# Patient Record
Sex: Female | Born: 1995 | Race: White | Hispanic: No | Smoking: Former smoker
Health system: Southern US, Community
[De-identification: ages and names within clinical notes are randomized; demographics above are authoritative.]

## PROBLEM LIST (undated history)

## (undated) ENCOUNTER — Inpatient Hospital Stay (HOSPITAL_COMMUNITY): Payer: Self-pay

## (undated) DIAGNOSIS — B999 Unspecified infectious disease: Secondary | ICD-10-CM

## (undated) DIAGNOSIS — A4902 Methicillin resistant Staphylococcus aureus infection, unspecified site: Secondary | ICD-10-CM

## (undated) DIAGNOSIS — D649 Anemia, unspecified: Secondary | ICD-10-CM

## (undated) HISTORY — PX: NO PAST SURGERIES: SHX2092

## (undated) HISTORY — PX: THERAPEUTIC ABORTION: SHX798

## (undated) HISTORY — DX: Methicillin resistant Staphylococcus aureus infection, unspecified site: A49.02

---
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
## 2016-03-25 ENCOUNTER — Encounter: Payer: Self-pay | Admitting: *Deleted

## 2016-04-01 ENCOUNTER — Encounter: Payer: Self-pay | Admitting: Obstetrics & Gynecology

## 2018-09-19 NOTE — L&D Delivery Note (Addendum)
Patient: Shannon Lewis MRN: [id number]  GBS status: negative, IAP given None  Patient is a 23 y.o. now G2P1011 s/p NSVD at [redacted]w[redacted]d, who was admitted for SROM. SROM 24h 86m prior to delivery with clear fluid.   Delivery Note At 12:43 AM a viable and healthy female was delivered via Vaginal, Spontaneous (Presentation: LOA; Vertex).  APGAR: pending; weight  pending.   Placenta status: spontaneous, intact.  3 vessel cord. With no complications.   Anesthesia:  Epidural Episiotomy: None Lacerations: Superficial bilateral labial lacerations Suture Repair: N/A Est. Blood Loss (mL): 300  Head delivered LOA. One loose nuchal cord present. Shoulder and body delivered in usual fashion. Infant with spontaneous cry, placed on mother's abdomen, dried and bulb suctioned. Cord clamped x 2 after 1-minute delay, and cut by family member. Cord blood drawn. Placenta delivered spontaneously with gentle cord traction. Fundus firm with massage and Pitocin. Perineum inspected and found to have small bilateral superficial labial lacerations, which was found to be hemostatic.  Mom to postpartum.  Baby to Couplet care / Skin to Skin.  [place] 06/23/2019, 12:59 AM

## 2018-11-05 ENCOUNTER — Encounter: Payer: Self-pay | Admitting: Obstetrics

## 2018-11-05 ENCOUNTER — Ambulatory Visit (INDEPENDENT_AMBULATORY_CARE_PROVIDER_SITE_OTHER): Payer: Self-pay

## 2018-11-05 DIAGNOSIS — Z3201 Encounter for pregnancy test, result positive: Secondary | ICD-10-CM

## 2018-11-05 DIAGNOSIS — Z3200 Encounter for pregnancy test, result unknown: Secondary | ICD-10-CM

## 2018-11-05 LAB — POCT URINE PREGNANCY: Preg Test, Ur: POSITIVE — AB

## 2018-11-05 NOTE — Progress Notes (Signed)
Pt is here for confirmation of pregnancy, UPT is positive. Pt given safe medication list and states she is currently taking an OTC prenatal vitamin. Pt instructed to make Initial OB visit the 1st or 2nd week of march based on LMP of 09/11/18. Pt verbalized understanding.

## 2018-12-11 ENCOUNTER — Other Ambulatory Visit (HOSPITAL_COMMUNITY)
Admission: RE | Admit: 2018-12-11 | Discharge: 2018-12-11 | Disposition: A | Discharge status: Home / Self Care | Payer: Medicaid Other | Source: Ambulatory Visit | Attending: Obstetrics | Admitting: Obstetrics

## 2018-12-11 ENCOUNTER — Encounter: Payer: Self-pay | Admitting: Obstetrics

## 2018-12-11 ENCOUNTER — Ambulatory Visit (INDEPENDENT_AMBULATORY_CARE_PROVIDER_SITE_OTHER): Payer: Medicaid Other | Admitting: Obstetrics

## 2018-12-11 ENCOUNTER — Other Ambulatory Visit: Payer: Self-pay

## 2018-12-11 VITALS — BP 104/64 | HR 108 | Ht 63.0 in | Wt 122.7 lb

## 2018-12-11 DIAGNOSIS — Z3481 Encounter for supervision of other normal pregnancy, first trimester: Secondary | ICD-10-CM

## 2018-12-11 DIAGNOSIS — Z3A13 13 weeks gestation of pregnancy: Secondary | ICD-10-CM

## 2018-12-11 DIAGNOSIS — Z3480 Encounter for supervision of other normal pregnancy, unspecified trimester: Secondary | ICD-10-CM | POA: Diagnosis present

## 2018-12-11 DIAGNOSIS — N3001 Acute cystitis with hematuria: Secondary | ICD-10-CM

## 2018-12-11 DIAGNOSIS — O2311 Infections of bladder in pregnancy, first trimester: Secondary | ICD-10-CM

## 2018-12-11 LAB — POCT URINALYSIS DIPSTICK
Bilirubin, UA: NEGATIVE
Glucose, UA: NEGATIVE
Ketones, UA: NEGATIVE
LEUKOCYTES UA: NEGATIVE
Nitrite, UA: POSITIVE
Protein, UA: NEGATIVE
Spec Grav, UA: 1.015 (ref 1.010–1.025)
Urobilinogen, UA: 0.2 E.U./dL
pH, UA: 6.5 (ref 5.0–8.0)

## 2018-12-11 MED ORDER — NITROFURANTOIN MONOHYD MACRO 100 MG PO CAPS: 100.0000 mg | ORAL_CAPSULE | Freq: Two times a day (BID) | ORAL | 0 refills | Status: DC

## 2018-12-11 MED ORDER — PRENATE MINI 29-0.6-0.4-350 MG PO CAPS: 1.0000 | ORAL_CAPSULE | Freq: Every day | ORAL | 4 refills | Status: DC

## 2018-12-11 NOTE — Progress Notes (Signed)
Pt is here for initial prenatal visit. LMP 09/11/18. EDD 06/18/19.

## 2018-12-11 NOTE — Progress Notes (Signed)
Subjective:    Shannon Lewis is being seen today for her first obstetrical visit.  This is not a planned pregnancy. She is at [redacted]w[redacted]d gestation. Her obstetrical history is significant for NONE. Relationship with FOB: significant other, not living together. Patient does intend to breast feed. Pregnancy history fully reviewed.  The information documented in the HPI was reviewed and verified.  Menstrual History: OB History    Gravida  2   Para      Term      Preterm      AB  1   Living        SAB      TAB  1   Ectopic      Multiple      Live Births               Patient's last menstrual period was 09/11/2018 (exact date).    Past Medical History:  Diagnosis Date  . MRSA (methicillin resistant Staphylococcus aureus) infection 2015    Past Surgical History:  Procedure Laterality Date  . NO PAST SURGERIES      (Not in a hospital admission)  Not on File  Social History   Tobacco Use  . Smoking status: Never Smoker  . Smokeless tobacco: Never Used  Substance Use Topics  . Alcohol use: Not Currently    Family History  Problem Relation Age of Onset  . COPD Mother   . Asthma Mother      Review of Systems Constitutional: negative for weight loss Gastrointestinal: negative for vomiting Genitourinary:negative for genital lesions and vaginal discharge and dysuria Musculoskeletal:negative for back pain Behavioral/Psych: negative for abusive relationship, depression, illegal drug usage and tobacco use    Objective:    BP 104/64   Pulse (!) 108   Ht 5\' 3"  (1.6 m)   Wt 122 lb 11.2 oz (55.7 kg)   LMP 09/11/2018 (Exact Date)   BMI 21.74 kg/m  General Appearance:    Alert, cooperative, no distress, appears stated age  Head:    Normocephalic, without obvious abnormality, atraumatic  Eyes:    PERRL, conjunctiva/corneas clear, EOM's intact, fundi    benign, both eyes  Ears:    Normal TM's and external ear canals, both ears  Nose:   Nares normal, septum  midline, mucosa normal, no drainage    or sinus tenderness  Throat:   Lips, mucosa, and tongue normal; teeth and gums normal  Neck:   Supple, symmetrical, trachea midline, no adenopathy;    thyroid:  no enlargement/tenderness/nodules; no carotid   bruit or JVD  Back:     Symmetric, no curvature, ROM normal, no CVA tenderness  Lungs:     Clear to auscultation bilaterally, respirations unlabored  Chest Wall:    No tenderness or deformity   Heart:    Regular rate and rhythm, S1 and S2 normal, no murmur, rub   or gallop  Breast Exam:    No tenderness, masses, or nipple abnormality  Abdomen:     Soft, non-tender, bowel sounds active all four quadrants,    no masses, no organomegaly  Genitalia:    Normal female without lesion, discharge or tenderness  Extremities:   Extremities normal, atraumatic, no cyanosis or edema  Pulses:   2+ and symmetric all extremities  Skin:   Skin color, texture, turgor normal, no rashes or lesions  Lymph nodes:   Cervical, supraclavicular, and axillary nodes normal  Neurologic:   CNII-XII intact, normal strength, sensation and reflexes  throughout      Lab Review Urine pregnancy test Labs reviewed yes Results for ALETHEIA, BICKLER (MRN [id number]) as of 12/11/2018 15:24  Ref. Range 12/11/2018 15:01  Bilirubin, UA Unknown negative  Clarity, UA Unknown cloudy  Color, UA Unknown yellow  Glucose Latest Ref Range: Negative  Negative  Ketones, UA Unknown negative  Leukocytes, UA Latest Ref Range: Negative  Negative  Nitrite, UA Unknown positive  pH, UA Latest Ref Range: 5.0 - 8.0  6.5  Protein, UA Latest Ref Range: Negative  Negative  Specific Gravity, UA Latest Ref Range: 1.010 - 1.025  1.015  Urobilinogen, UA Latest Ref Range: 0.2 or 1.0 E.U./dL 0.2  RBC, UA Unknown large   Radiologic studies reviewed no  Assessment:    Pregnancy at [redacted]w[redacted]d weeks    Plan:     1. Supervision of other normal pregnancy, antepartum Rx: - Obstetric Panel, Including HIV -  Culture, OB Urine - Genetic Screening - Cytology - PAP( [place]) - Prenatal Vit-Fe Fumarate-FA (PRENATAL MULTIVITAMIN) TABS tablet; Take 1 tablet by mouth daily at 12 noon. - Cervicovaginal ancillary only( [place]) - Ferritin - Prenat w/o A-FeCbn-Meth-FA-DHA (PRENATE MINI) 29-0.6-0.4-350 MG CAPS; Take 1 capsule by mouth daily before breakfast.  Dispense: 90 capsule; Refill: 4 - POCT urinalysis dipstick  2. Acute cystitis with hematuria Rx: - nitrofurantoin, macrocrystal-monohydrate, (MACROBID) 100 MG capsule; Take 1 capsule (100 mg total) by mouth 2 (two) times daily.  Dispense: 14 capsule; Refill: 0  Prenatal vitamins.  Counseling provided regarding continued use of seat belts, cessation of alcohol consumption, smoking or use of illicit drugs; infection precautions i.e., influenza/TDAP immunizations, toxoplasmosis,CMV, parvovirus, listeria and varicella; workplace safety, exercise during pregnancy; routine dental care, safe medications, sexual activity, hot tubs, saunas, pools, travel, caffeine use, fish and methlymercury, potential toxins, hair treatments, varicose veins Weight gain recommendations per IOM guidelines reviewed: underweight/BMI< 18.5--> gain 28 - 40 lbs; normal weight/BMI 18.5 - 24.9--> gain 25 - 35 lbs; overweight/BMI 25 - 29.9--> gain 15 - 25 lbs; obese/BMI >30->gain  11 - 20 lbs Problem list reviewed and updated. FIRST/CF mutation testing/NIPT/QUAD SCREEN/fragile X/Ashkenazi Jewish population testing/Spinal muscular atrophy discussed: requested. Role of ultrasound in pregnancy discussed; fetal survey: requested. Amniocentesis discussed: not indicated.   Meds ordered this encounter  Medications  . Prenat w/o A-FeCbn-Meth-FA-DHA (PRENATE MINI) 29-0.6-0.4-350 MG CAPS    Sig: Take 1 capsule by mouth daily before breakfast.    Dispense:  90 capsule    Refill:  4  . nitrofurantoin, macrocrystal-monohydrate, (MACROBID) 100 MG capsule    Sig: Take 1 capsule (100 mg  total) by mouth 2 (two) times daily.    Dispense:  14 capsule    Refill:  0   Orders Placed This Encounter  Procedures  . Culture, OB Urine  . Obstetric Panel, Including HIV  . Genetic Screening    [place]  . Ferritin  . POCT urinalysis dipstick    Follow up in 3 weeks. 50% of 20 min visit spent on counseling and coordination of care.    Brock Bad MD 12-11-2018

## 2018-12-12 ENCOUNTER — Other Ambulatory Visit: Payer: Self-pay | Admitting: Obstetrics

## 2018-12-12 DIAGNOSIS — O99019 Anemia complicating pregnancy, unspecified trimester: Secondary | ICD-10-CM

## 2018-12-12 LAB — OBSTETRIC PANEL, INCLUDING HIV
Antibody Screen: NEGATIVE
BASOS ABS: 0.1 10*3/uL (ref 0.0–0.2)
Basos: 1 %
EOS (ABSOLUTE): 0.1 10*3/uL (ref 0.0–0.4)
Eos: 1 %
HEMATOCRIT: 30.8 % — AB (ref 34.0–46.6)
HEMOGLOBIN: 10.9 g/dL — AB (ref 11.1–15.9)
HEP B S AG: NEGATIVE
HIV SCREEN 4TH GENERATION: NONREACTIVE
Immature Grans (Abs): 0.1 10*3/uL (ref 0.0–0.1)
Immature Granulocytes: 1 %
LYMPHS ABS: 2.3 10*3/uL (ref 0.7–3.1)
Lymphs: 22 %
MCH: 29.9 pg (ref 26.6–33.0)
MCHC: 35.4 g/dL (ref 31.5–35.7)
MCV: 85 fL (ref 79–97)
MONOCYTES: 8 %
Monocytes Absolute: 0.8 10*3/uL (ref 0.1–0.9)
NEUTROS ABS: 7 10*3/uL (ref 1.4–7.0)
Neutrophils: 67 %
PLATELETS: 218 10*3/uL (ref 150–450)
RBC: 3.64 x10E6/uL — ABNORMAL LOW (ref 3.77–5.28)
RDW: 13.1 % (ref 11.7–15.4)
RH TYPE: POSITIVE
RPR: NONREACTIVE
RUBELLA: 1.26 {index} (ref >0.99)
WBC: 10.3 10*3/uL (ref 3.4–10.8)

## 2018-12-12 LAB — CERVICOVAGINAL ANCILLARY ONLY
Bacterial vaginitis: NEGATIVE
CANDIDA VAGINITIS: POSITIVE — AB
Chlamydia: NEGATIVE
Neisseria Gonorrhea: NEGATIVE
TRICH (WINDOWPATH): NEGATIVE

## 2018-12-12 MED ORDER — FERROUS SULFATE 325 (65 FE) MG PO TABS: 325.0000 mg | ORAL_TABLET | Freq: Two times a day (BID) | ORAL | 5 refills | Status: DC

## 2018-12-13 ENCOUNTER — Other Ambulatory Visit: Payer: Self-pay | Admitting: Obstetrics

## 2018-12-13 DIAGNOSIS — B373 Candidiasis of vulva and vagina: Secondary | ICD-10-CM

## 2018-12-13 DIAGNOSIS — B3731 Acute candidiasis of vulva and vagina: Secondary | ICD-10-CM

## 2018-12-13 LAB — CYTOLOGY - PAP: DIAGNOSIS: NEGATIVE

## 2018-12-13 MED ORDER — TERCONAZOLE 0.4 % VA CREA: 1.0000 | TOPICAL_CREAM | Freq: Every day | VAGINAL | 0 refills | Status: DC

## 2018-12-14 LAB — URINE CULTURE, OB REFLEX

## 2018-12-14 LAB — CULTURE, OB URINE

## 2018-12-17 ENCOUNTER — Encounter: Payer: Self-pay | Admitting: Obstetrics

## 2018-12-18 ENCOUNTER — Encounter: Payer: Self-pay | Admitting: Obstetrics

## 2019-01-01 ENCOUNTER — Ambulatory Visit (INDEPENDENT_AMBULATORY_CARE_PROVIDER_SITE_OTHER): Payer: Medicaid Other | Admitting: Obstetrics and Gynecology

## 2019-01-01 ENCOUNTER — Other Ambulatory Visit: Payer: Self-pay

## 2019-01-01 ENCOUNTER — Encounter: Payer: Self-pay | Admitting: Obstetrics and Gynecology

## 2019-01-01 DIAGNOSIS — O2342 Unspecified infection of urinary tract in pregnancy, second trimester: Secondary | ICD-10-CM | POA: Diagnosis not present

## 2019-01-01 DIAGNOSIS — Z3A16 16 weeks gestation of pregnancy: Secondary | ICD-10-CM

## 2019-01-01 DIAGNOSIS — O2340 Unspecified infection of urinary tract in pregnancy, unspecified trimester: Secondary | ICD-10-CM | POA: Insufficient documentation

## 2019-01-01 DIAGNOSIS — Z3480 Encounter for supervision of other normal pregnancy, unspecified trimester: Secondary | ICD-10-CM

## 2019-01-01 NOTE — Progress Notes (Signed)
   TELEHEALTH VIRTUAL OBSTETRICS VISIT ENCOUNTER NOTE  I connected with NICLOLE MCBATH on 01/01/19 at  2:15 PM EDT by telephone at home and verified that I am speaking with the correct person using two identifiers.   I discussed the limitations, risks, security and privacy concerns of performing an evaluation and management service by telephone and the availability of in person appointments. I also discussed with the patient that there may be a patient responsible charge related to this service. The patient expressed understanding and agreed to proceed   Date: 01/01/2019 Clinic: [facility]  Subjective:  TOLEEN SPRADLIN is a 23 y.o. G2P0010 at [redacted]w[redacted]d being seen today for ongoing prenatal care.  She is currently monitored for the following issues for this low-risk pregnancy and has Supervision of other normal pregnancy, antepartum and UTI (urinary tract infection) in pregnancy, antepartum on their problem list.  Patient reports no complaints.   Contractions: Not present. Vag. Bleeding: None.  Movement: Absent. Denies leaking of fluid.   The following portions of the patient's history were reviewed and updated as appropriate: allergies, current medications, past family history, past medical history, past social history, past surgical history and problem list. Problem list updated.  Objective:  There were no vitals filed for this visit.  Fetal Status:     Movement: Absent     General:  Alert, oriented and cooperative. Patient is in no acute distress.  Skin: Skin is warm and dry. No rash noted.   Cardiovascular: Normal heart rate noted  Respiratory: Normal respiratory effort, no problems with respiration noted  Abdomen: Soft, gravid, appropriate for gestational age. Pain/Pressure: Absent     Pelvic:  Cervical exam deferred        Extremities: Normal range of motion.  Edema: None  Mental Status: Normal mood and affect. Normal behavior. Normal judgment and thought content.   Urinalysis:       Assessment and Plan:  Pregnancy: G2P0010 at [redacted]w[redacted]d  1. Supervision of other normal pregnancy, antepartum Routine care. Set up for anatomy in 2-3wks. Pt has bp cuff and scale and to take measurements and let us know - Babyscripts Schedule Optimization  2. UTI (urinary tract infection) in pregnancy, antepartum Took abx w/o isue. Will set up for 2wk lab only visit for toc.    Preterm labor symptoms and general obstetric precautions including but not limited to vaginal bleeding, contractions, leaking of fluid and fetal movement were reviewed in detail with the patient.  I discussed the assessment and treatment plan with the patient. The patient was provided an opportunity to ask questions and all were answered. The patient agreed with the plan and demonstrated an understanding of the instructions. The patient was advised to call back or seek an in-person office evaluation/go to MAU at [facility] for any urgent or concerning symptoms. Please refer to After Visit Summary for other counseling recommendations.   I provided 15 minutes of non-face-to-face time during this encounter.  Return in about 4 weeks (around 01/29/2019).  Future Appointments  Date Time Provider Department Center  01/23/2019 11:00 AM WH-MFC [place] 3 WH-MFCUS MFC-US    [place] Bing, MD Center for [facility] [place], [facility]  [place] Bing, MD

## 2019-01-01 NOTE — Progress Notes (Signed)
ROB w/ no concerns today.  

## 2019-01-15 ENCOUNTER — Other Ambulatory Visit: Payer: Self-pay

## 2019-01-15 ENCOUNTER — Other Ambulatory Visit: Payer: Medicaid Other

## 2019-01-15 DIAGNOSIS — O2340 Unspecified infection of urinary tract in pregnancy, unspecified trimester: Secondary | ICD-10-CM

## 2019-01-15 DIAGNOSIS — Z3480 Encounter for supervision of other normal pregnancy, unspecified trimester: Secondary | ICD-10-CM

## 2019-01-17 LAB — CULTURE, OB URINE

## 2019-01-17 LAB — URINE CULTURE, OB REFLEX

## 2019-01-23 ENCOUNTER — Ambulatory Visit (HOSPITAL_COMMUNITY)
Admission: RE | Admit: 2019-01-23 | Discharge: 2019-01-23 | Disposition: A | Discharge status: Home / Self Care | Payer: Medicaid Other | Source: Ambulatory Visit | Attending: Obstetrics and Gynecology | Admitting: Obstetrics and Gynecology

## 2019-01-23 ENCOUNTER — Other Ambulatory Visit (HOSPITAL_COMMUNITY): Payer: Self-pay | Admitting: *Deleted

## 2019-01-23 ENCOUNTER — Other Ambulatory Visit: Payer: Self-pay

## 2019-01-23 DIAGNOSIS — Z3480 Encounter for supervision of other normal pregnancy, unspecified trimester: Secondary | ICD-10-CM | POA: Diagnosis present

## 2019-01-23 DIAGNOSIS — Z3A17 17 weeks gestation of pregnancy: Secondary | ICD-10-CM | POA: Diagnosis not present

## 2019-01-23 DIAGNOSIS — Z3687 Encounter for antenatal screening for uncertain dates: Secondary | ICD-10-CM | POA: Diagnosis not present

## 2019-01-23 DIAGNOSIS — Z362 Encounter for other antenatal screening follow-up: Secondary | ICD-10-CM

## 2019-01-23 DIAGNOSIS — Z363 Encounter for antenatal screening for malformations: Secondary | ICD-10-CM

## 2019-01-29 ENCOUNTER — Other Ambulatory Visit: Payer: Self-pay

## 2019-01-29 ENCOUNTER — Ambulatory Visit (INDEPENDENT_AMBULATORY_CARE_PROVIDER_SITE_OTHER): Payer: Medicaid Other | Admitting: Obstetrics & Gynecology

## 2019-01-29 VITALS — BP 106/59 | HR 91 | Wt 130.1 lb

## 2019-01-29 DIAGNOSIS — Z3482 Encounter for supervision of other normal pregnancy, second trimester: Secondary | ICD-10-CM

## 2019-01-29 DIAGNOSIS — Z3480 Encounter for supervision of other normal pregnancy, unspecified trimester: Secondary | ICD-10-CM

## 2019-01-29 DIAGNOSIS — Z3A18 18 weeks gestation of pregnancy: Secondary | ICD-10-CM

## 2019-01-29 NOTE — Progress Notes (Signed)
Pt presents for webex visit. Pt identified with two pt identifers. She is 101w6d. Her bp was taken while on the phone. Pt has no concerns.

## 2019-01-29 NOTE — Progress Notes (Signed)
   TELEHEALTH VIRTUAL OBSTETRICS VISIT ENCOUNTER NOTE  I connected with Shannon Lewis on 01/29/19 at  2:15 PM EDT by telephone at home and verified that I am speaking with the correct person using two identifiers.   I discussed the limitations, risks, security and privacy concerns of performing an evaluation and management service by telephone and the availability of in person appointments. I also discussed with the patient that there may be a patient responsible charge related to this service. The patient expressed understanding and agreed to proceed.  Subjective:  Shannon Lewis is a 23 y.o. G2P0010 at [redacted]w[redacted]d being followed for ongoing prenatal care.  She is currently monitored for the following issues for this low-risk pregnancy and has Supervision of other normal pregnancy, antepartum and UTI (urinary tract infection) in pregnancy, antepartum on their problem list.  Patient reports nausea. Reports fetal movement. Denies any contractions, bleeding or leaking of fluid.   The following portions of the patient's history were reviewed and updated as appropriate: allergies, current medications, past family history, past medical history, past social history, past surgical history and problem list.   Objective:   General:  Alert, oriented and cooperative.   Mental Status: Normal mood and affect perceived. Normal judgment and thought content.  Rest of physical exam deferred due to type of encounter  Assessment and Plan:  Pregnancy: G2P0010 at [redacted]w[redacted]d 1. Supervision of other normal pregnancy, antepartum [place] was normal, EDC calculated  Preterm labor symptoms and general obstetric precautions including but not limited to vaginal bleeding, contractions, leaking of fluid and fetal movement were reviewed in detail with the patient.  I discussed the assessment and treatment plan with the patient. The patient was provided an opportunity to ask questions and all were answered. The patient agreed with  the plan and demonstrated an understanding of the instructions. The patient was advised to call back or seek an in-person office evaluation/go to MAU at [facility] for any urgent or concerning symptoms. Please refer to After Visit Summary for other counseling recommendations.   I provided 10 minutes of non-face-to-face time during this encounter.  Return in about 9 weeks (around 04/02/2019) for 2 hr, using babyscripts.  Future Appointments  Date Time Provider Department Center  02/20/2019  1:45 PM WH-MFC [place] 5 WH-MFCUS MFC-US    Scheryl Darter, MD Center for [facility], [facility]

## 2019-01-29 NOTE — Patient Instructions (Signed)

## 2019-02-20 ENCOUNTER — Ambulatory Visit (HOSPITAL_COMMUNITY)
Admission: RE | Admit: 2019-02-20 | Discharge: 2019-02-20 | Disposition: A | Discharge status: Home / Self Care | Payer: Medicaid Other | Source: Ambulatory Visit | Attending: Obstetrics and Gynecology | Admitting: Obstetrics and Gynecology

## 2019-02-20 ENCOUNTER — Other Ambulatory Visit: Payer: Self-pay

## 2019-02-20 DIAGNOSIS — Z3A21 21 weeks gestation of pregnancy: Secondary | ICD-10-CM

## 2019-02-20 DIAGNOSIS — Z362 Encounter for other antenatal screening follow-up: Secondary | ICD-10-CM | POA: Diagnosis present

## 2019-04-02 ENCOUNTER — Other Ambulatory Visit: Payer: Medicaid Other

## 2019-04-02 ENCOUNTER — Other Ambulatory Visit: Payer: Self-pay

## 2019-04-02 ENCOUNTER — Ambulatory Visit (INDEPENDENT_AMBULATORY_CARE_PROVIDER_SITE_OTHER): Payer: Medicaid Other | Admitting: Obstetrics & Gynecology

## 2019-04-02 DIAGNOSIS — Z3480 Encounter for supervision of other normal pregnancy, unspecified trimester: Secondary | ICD-10-CM

## 2019-04-02 DIAGNOSIS — Z3A27 27 weeks gestation of pregnancy: Secondary | ICD-10-CM

## 2019-04-02 DIAGNOSIS — Z3482 Encounter for supervision of other normal pregnancy, second trimester: Secondary | ICD-10-CM

## 2019-04-02 NOTE — Patient Instructions (Signed)

## 2019-04-02 NOTE — Progress Notes (Signed)
Patient reports fetal movement, denies pain. 

## 2019-04-02 NOTE — Progress Notes (Signed)
   PRENATAL VISIT NOTE  Subjective:  Shannon Lewis is a 23 y.o. G2P0010 at [redacted]w[redacted]d being seen today for ongoing prenatal care.  She is currently monitored for the following issues for this low-risk pregnancy and has Supervision of other normal pregnancy, antepartum and UTI (urinary tract infection) in pregnancy, antepartum on their problem list.  Patient reports no complaints.  Contractions: Not present. Vag. Bleeding: None.  Movement: Present. Denies leaking of fluid.   The following portions of the patient's history were reviewed and updated as appropriate: allergies, current medications, past family history, past medical history, past social history, past surgical history and problem list.   Objective:   Vitals:   04/02/19 0913  BP: 104/64  Pulse: 87  Weight: 147 lb 9.6 oz (67 kg)    Fetal Status: Fetal Heart Rate (bpm): 148 Fundal Height: 29 cm Movement: Present     General:  Alert, oriented and cooperative. Patient is in no acute distress.  Skin: Skin is warm and dry. No rash noted.   Cardiovascular: Normal heart rate noted  Respiratory: Normal respiratory effort, no problems with respiration noted  Abdomen: Soft, gravid, appropriate for gestational age.  Pain/Pressure: Absent     Pelvic: Cervical exam deferred        Extremities: Normal range of motion.  Edema: None  Mental Status: Normal mood and affect. Normal behavior. Normal judgment and thought content.   Assessment and Plan:  Pregnancy: G2P0010 at [redacted]w[redacted]d 1. Supervision of other normal pregnancy, antepartum Routine 28 week - Glucose Tolerance, 2 Hours w/1 Hour - CBC - RPR - HIV Antibody (routine testing w rflx)  Preterm labor symptoms and general obstetric precautions including but not limited to vaginal bleeding, contractions, leaking of fluid and fetal movement were reviewed in detail with the patient. Please refer to After Visit Summary for other counseling recommendations.   Return in about 4 weeks (around  04/30/2019) for virtual.  No future appointments.  Emeterio Reeve, MD

## 2019-04-03 LAB — CBC
Hematocrit: 28.6 % — ABNORMAL LOW (ref 34.0–46.6)
Hemoglobin: 9.7 g/dL — ABNORMAL LOW (ref 11.1–15.9)
MCH: 32.2 pg (ref 26.6–33.0)
MCHC: 33.9 g/dL (ref 31.5–35.7)
MCV: 95 fL (ref 79–97)
Platelets: 241 10*3/uL (ref 150–450)
RBC: 3.01 x10E6/uL — ABNORMAL LOW (ref 3.77–5.28)
RDW: 12.4 % (ref 11.7–15.4)
WBC: 15.2 10*3/uL — ABNORMAL HIGH (ref 3.4–10.8)

## 2019-04-03 LAB — GLUCOSE TOLERANCE, 2 HOURS W/ 1HR
Glucose, 1 hour: 161 mg/dL (ref 65–179)
Glucose, 2 hour: 84 mg/dL (ref 65–152)
Glucose, Fasting: 85 mg/dL (ref 65–91)

## 2019-04-03 LAB — RPR: RPR Ser Ql: NONREACTIVE

## 2019-04-03 LAB — HIV ANTIBODY (ROUTINE TESTING W REFLEX): HIV Screen 4th Generation wRfx: NONREACTIVE

## 2019-04-30 ENCOUNTER — Encounter: Payer: Self-pay | Admitting: Obstetrics

## 2019-04-30 ENCOUNTER — Ambulatory Visit (INDEPENDENT_AMBULATORY_CARE_PROVIDER_SITE_OTHER): Payer: Medicaid Other | Admitting: Obstetrics

## 2019-04-30 DIAGNOSIS — Z3A31 31 weeks gestation of pregnancy: Secondary | ICD-10-CM

## 2019-04-30 DIAGNOSIS — Z3483 Encounter for supervision of other normal pregnancy, third trimester: Secondary | ICD-10-CM | POA: Diagnosis not present

## 2019-04-30 DIAGNOSIS — Z3480 Encounter for supervision of other normal pregnancy, unspecified trimester: Secondary | ICD-10-CM

## 2019-04-30 NOTE — Progress Notes (Addendum)
   TELEHEALTH VIRTUAL OBSTETRICS VISIT ENCOUNTER NOTE  I connected with Shannon Lewis on 04/30/19 at  9:00 AM EDT by telephone at home and verified that I am speaking with the correct person using two identifiers.   I discussed the limitations, risks, security and privacy concerns of performing an evaluation and management service by telephone and the availability of in person appointments. I also discussed with the patient that there may be a patient responsible charge related to this service. The patient expressed understanding and agreed to proceed.  Subjective:  Shannon Lewis is a 23 y.o. G2P0010 at [redacted]w[redacted]d being followed for ongoing prenatal care.  She is currently monitored for the following issues for this low-risk pregnancy and has Supervision of other normal pregnancy, antepartum and UTI (urinary tract infection) in pregnancy, antepartum on their problem list.  Patient reports backache and heartburn. Reports fetal movement. Denies any contractions, bleeding or leaking of fluid.   The following portions of the patient's history were reviewed and updated as appropriate: allergies, current medications, past family history, past medical history, past social history, past surgical history and problem list.   Objective:   General:  Alert, oriented and cooperative.   Mental Status: Normal mood and affect perceived. Normal judgment and thought content.  Rest of physical exam deferred due to type of encounter  Assessment and Plan:  Pregnancy: G2P0010 at [redacted]w[redacted]d 1. Supervision of other normal pregnancy, antepartum   Preterm labor symptoms and general obstetric precautions including but not limited to vaginal bleeding, contractions, leaking of fluid and fetal movement were reviewed in detail with the patient.  I discussed the assessment and treatment plan with the patient. The patient was provided an opportunity to ask questions and all were answered. The patient agreed with the plan and  demonstrated an understanding of the instructions. The patient was advised to call back or seek an in-person office evaluation/go to MAU at [facility] for any urgent or concerning symptoms. Please refer to After Visit Summary for other counseling recommendations.   I provided 10 minutes of non-face-to-face time during this encounter.  Follow up in 2 weeks with [place], [place] for [place], [place]  I connected with Shannon Lewis on 04/30/19 at  9:00 AM EDT by telephone and verified that I am speaking with the correct person using two identifiers.   Pt unable to do webex nor my chart. She does not have her phone today.

## 2019-05-31 ENCOUNTER — Other Ambulatory Visit (HOSPITAL_COMMUNITY)
Admission: RE | Admit: 2019-05-31 | Discharge: 2019-05-31 | Disposition: A | Discharge status: Home / Self Care | Payer: Medicaid Other | Source: Ambulatory Visit | Attending: Nurse Practitioner | Admitting: Nurse Practitioner

## 2019-05-31 ENCOUNTER — Other Ambulatory Visit: Payer: Self-pay

## 2019-05-31 ENCOUNTER — Ambulatory Visit (INDEPENDENT_AMBULATORY_CARE_PROVIDER_SITE_OTHER): Payer: Medicaid Other | Admitting: Nurse Practitioner

## 2019-05-31 DIAGNOSIS — Z3A36 36 weeks gestation of pregnancy: Secondary | ICD-10-CM

## 2019-05-31 DIAGNOSIS — Z3483 Encounter for supervision of other normal pregnancy, third trimester: Secondary | ICD-10-CM

## 2019-05-31 DIAGNOSIS — Z3480 Encounter for supervision of other normal pregnancy, unspecified trimester: Secondary | ICD-10-CM | POA: Insufficient documentation

## 2019-05-31 NOTE — Progress Notes (Signed)
Patient reports fetal movement with pressure. 

## 2019-05-31 NOTE — Patient Instructions (Signed)

## 2019-05-31 NOTE — Progress Notes (Signed)
    Subjective:  Shannon Lewis is a 23 y.o. G2P0010 at [redacted]w[redacted]d being seen today for ongoing prenatal care.  She is currently monitored for the following issues for this low-risk pregnancy and has Supervision of other normal pregnancy, antepartum and UTI (urinary tract infection) in pregnancy, antepartum on their problem list.  Patient reports no complaints.  Contractions: Not present. Vag. Bleeding: None.  Movement: Present. Denies leaking of fluid.   The following portions of the patient's history were reviewed and updated as appropriate: allergies, current medications, past family history, past medical history, past social history, past surgical history and problem list. Problem list updated.  Objective:   Vitals:   05/31/19 0825  BP: 120/77  Pulse: 88  Weight: 153 lb 6.4 oz (69.6 kg)    Fetal Status: Fetal Heart Rate (bpm): 138 Fundal Height: 38 cm Movement: Present     General:  Alert, oriented and cooperative. Patient is in no acute distress.  Skin: Skin is warm and dry. No rash noted.   Cardiovascular: Normal heart rate noted  Respiratory: Normal respiratory effort, no problems with respiration noted  Abdomen: Soft, gravid, appropriate for gestational age. Pain/Pressure: Present     Pelvic:  Cervical exam deferred      GC/Chlam GBS done  Extremities: Normal range of motion.  Edema: Trace  Mental Status: Normal mood and affect. Normal behavior. Normal judgment and thought content.   Urinalysis:      Assessment and Plan:  Pregnancy: G2P0010 at [redacted]w[redacted]d  1. Supervision of other normal pregnancy, antepartum Has not used baby scripts app but is taking BP weekly Has not been seen since August as she was not called back to schedule a visit.  Client called [place] to schedule another appoitnment.  Preterm labor symptoms and general obstetric precautions including but not limited to vaginal bleeding, contractions, leaking of fluid and fetal movement were reviewed in detail with the  patient. Please refer to After Visit Summary for other counseling recommendations.  Return in about 1 week (around 06/07/2019) for virtual [place] visit.  Earlie Server, RN, MSN, NP-BC Nurse Practitioner, [facility] for [employer], [facility] 05/31/2019 8:46 AM

## 2019-06-01 LAB — CERVICOVAGINAL ANCILLARY ONLY
Chlamydia: NEGATIVE
Neisseria Gonorrhea: NEGATIVE

## 2019-06-02 LAB — STREP GP B NAA: Strep Gp B NAA: NEGATIVE

## 2019-06-06 ENCOUNTER — Other Ambulatory Visit: Payer: Self-pay

## 2019-06-06 ENCOUNTER — Encounter (HOSPITAL_COMMUNITY): Payer: Self-pay | Admitting: *Deleted

## 2019-06-06 ENCOUNTER — Inpatient Hospital Stay (HOSPITAL_COMMUNITY)
Admission: AD | Admit: 2019-06-06 | Discharge: 2019-06-06 | Disposition: A | Discharge status: Home / Self Care | Payer: Medicaid Other | Attending: Obstetrics and Gynecology | Admitting: Obstetrics and Gynecology

## 2019-06-06 ENCOUNTER — Telehealth: Payer: Self-pay

## 2019-06-06 DIAGNOSIS — O471 False labor at or after 37 completed weeks of gestation: Secondary | ICD-10-CM | POA: Insufficient documentation

## 2019-06-06 DIAGNOSIS — Z3A37 37 weeks gestation of pregnancy: Secondary | ICD-10-CM | POA: Diagnosis not present

## 2019-06-06 DIAGNOSIS — Z3480 Encounter for supervision of other normal pregnancy, unspecified trimester: Secondary | ICD-10-CM

## 2019-06-06 DIAGNOSIS — O479 False labor, unspecified: Secondary | ICD-10-CM

## 2019-06-06 HISTORY — DX: Unspecified infectious disease: B99.9

## 2019-06-06 HISTORY — DX: Anemia, unspecified: D64.9

## 2019-06-06 NOTE — Discharge Instructions (Signed)
Braxton Hicks Contractions Contractions of the uterus can occur throughout pregnancy, but they are not always a sign that you are in labor. You may have practice contractions called Braxton Hicks contractions. These false labor contractions are sometimes confused with true labor. What are Braxton Hicks contractions? Braxton Hicks contractions are tightening movements that occur in the muscles of the uterus before labor. Unlike true labor contractions, these contractions do not result in opening (dilation) and thinning of the cervix. Toward the end of pregnancy (32-34 weeks), Braxton Hicks contractions can happen more often and may become stronger. These contractions are sometimes difficult to tell apart from true labor because they can be very uncomfortable. You should not feel embarrassed if you go to the hospital with false labor. Sometimes, the only way to tell if you are in true labor is for your health care provider to look for changes in the cervix. The health care provider will do a physical exam and may monitor your contractions. If you are not in true labor, the exam should show that your cervix is not dilating and your water has not broken. If there are no other health problems associated with your pregnancy, it is completely safe for you to be sent home with false labor. You may continue to have Braxton Hicks contractions until you go into true labor. How to tell the difference between true labor and false labor True labor  Contractions last 30-70 seconds.  Contractions become very regular.  Discomfort is usually felt in the top of the uterus, and it spreads to the lower abdomen and low back.  Contractions do not go away with walking.  Contractions usually become more intense and increase in frequency.  The cervix dilates and gets thinner. False labor  Contractions are usually shorter and not as strong as true labor contractions.  Contractions are usually irregular.  Contractions  are often felt in the front of the lower abdomen and in the groin.  Contractions may go away when you walk around or change positions while lying down.  Contractions get weaker and are shorter-lasting as time goes on.  The cervix usually does not dilate or become thin. Follow these instructions at home:   Take over-the-counter and prescription medicines only as told by your health care provider.  Keep up with your usual exercises and follow other instructions from your health care provider.  Eat and drink lightly if you think you are going into labor.  If Braxton Hicks contractions are making you uncomfortable: ? Change your position from lying down or resting to walking, or change from walking to resting. ? Sit and rest in a tub of warm water. ? Drink enough fluid to keep your urine pale yellow. Dehydration may cause these contractions. ? Do slow and deep breathing several times an hour.  Keep all follow-up prenatal visits as told by your health care provider. This is important. Contact a health care provider if:  You have a fever.  You have continuous pain in your abdomen. Get help right away if:  Your contractions become stronger, more regular, and closer together.  You have fluid leaking or gushing from your vagina.  You pass blood-tinged mucus (bloody show).  You have bleeding from your vagina.  You have low back pain that you never had before.  You feel your baby's head pushing down and causing pelvic pressure.  Your baby is not moving inside you as much as it used to. Summary  Contractions that occur before labor are   called Braxton Hicks contractions, false labor, or practice contractions.  Braxton Hicks contractions are usually shorter, weaker, farther apart, and less regular than true labor contractions. True labor contractions usually become progressively stronger and regular, and they become more frequent.  Manage discomfort from Braxton Hicks contractions  by changing position, resting in a warm bath, drinking plenty of water, or practicing deep breathing. This information is not intended to replace advice given to you by your health care provider. Make sure you discuss any questions you have with your health care provider. Document Released: 01/19/2017 Document Revised: 08/18/2017 Document Reviewed: 01/19/2017 Elsevier Patient Education  2020 Elsevier Inc.  

## 2019-06-06 NOTE — Telephone Encounter (Signed)
Pt is in transit to hospital due to vaginal bleeding.

## 2019-06-06 NOTE — MAU Note (Signed)
Last night started having some bleeding(none since last night) and some cloudy mucous. Started having some pretty bad cramps, they are now q 5-8 min, getting stronger. No placental issues on [place], denies recent exam.

## 2019-06-07 ENCOUNTER — Telehealth (INDEPENDENT_AMBULATORY_CARE_PROVIDER_SITE_OTHER): Payer: Medicaid Other | Admitting: Obstetrics

## 2019-06-07 ENCOUNTER — Encounter (HOSPITAL_COMMUNITY): Payer: Self-pay

## 2019-06-07 ENCOUNTER — Other Ambulatory Visit: Payer: Self-pay

## 2019-06-07 ENCOUNTER — Encounter: Payer: Self-pay | Admitting: Obstetrics

## 2019-06-07 ENCOUNTER — Inpatient Hospital Stay (HOSPITAL_COMMUNITY)
Admission: AD | Admit: 2019-06-07 | Discharge: 2019-06-07 | Disposition: A | Discharge status: Home / Self Care | Payer: Medicaid Other | Attending: Obstetrics and Gynecology | Admitting: Obstetrics and Gynecology

## 2019-06-07 VITALS — BP 138/95 | HR 104

## 2019-06-07 DIAGNOSIS — O471 False labor at or after 37 completed weeks of gestation: Secondary | ICD-10-CM | POA: Insufficient documentation

## 2019-06-07 DIAGNOSIS — Z3A37 37 weeks gestation of pregnancy: Secondary | ICD-10-CM | POA: Insufficient documentation

## 2019-06-07 DIAGNOSIS — Z3483 Encounter for supervision of other normal pregnancy, third trimester: Secondary | ICD-10-CM

## 2019-06-07 DIAGNOSIS — Z3480 Encounter for supervision of other normal pregnancy, unspecified trimester: Secondary | ICD-10-CM

## 2019-06-07 DIAGNOSIS — O479 False labor, unspecified: Secondary | ICD-10-CM

## 2019-06-07 NOTE — Discharge Instructions (Signed)
Braxton Hicks Contractions Contractions of the uterus can occur throughout pregnancy, but they are not always a sign that you are in labor. You may have practice contractions called Braxton Hicks contractions. These false labor contractions are sometimes confused with true labor. What are Braxton Hicks contractions? Braxton Hicks contractions are tightening movements that occur in the muscles of the uterus before labor. Unlike true labor contractions, these contractions do not result in opening (dilation) and thinning of the cervix. Toward the end of pregnancy (32-34 weeks), Braxton Hicks contractions can happen more often and may become stronger. These contractions are sometimes difficult to tell apart from true labor because they can be very uncomfortable. You should not feel embarrassed if you go to the hospital with false labor. Sometimes, the only way to tell if you are in true labor is for your health care provider to look for changes in the cervix. The health care provider will do a physical exam and may monitor your contractions. If you are not in true labor, the exam should show that your cervix is not dilating and your water has not broken. If there are no other health problems associated with your pregnancy, it is completely safe for you to be sent home with false labor. You may continue to have Braxton Hicks contractions until you go into true labor. How to tell the difference between true labor and false labor True labor  Contractions last 30-70 seconds.  Contractions become very regular.  Discomfort is usually felt in the top of the uterus, and it spreads to the lower abdomen and low back.  Contractions do not go away with walking.  Contractions usually become more intense and increase in frequency.  The cervix dilates and gets thinner. False labor  Contractions are usually shorter and not as strong as true labor contractions.  Contractions are usually irregular.  Contractions  are often felt in the front of the lower abdomen and in the groin.  Contractions may go away when you walk around or change positions while lying down.  Contractions get weaker and are shorter-lasting as time goes on.  The cervix usually does not dilate or become thin. Follow these instructions at home:   Take over-the-counter and prescription medicines only as told by your health care provider.  Keep up with your usual exercises and follow other instructions from your health care provider.  Eat and drink lightly if you think you are going into labor.  If Braxton Hicks contractions are making you uncomfortable: ? Change your position from lying down or resting to walking, or change from walking to resting. ? Sit and rest in a tub of warm water. ? Drink enough fluid to keep your urine pale yellow. Dehydration may cause these contractions. ? Do slow and deep breathing several times an hour.  Keep all follow-up prenatal visits as told by your health care provider. This is important. Contact a health care provider if:  You have a fever.  You have continuous pain in your abdomen. Get help right away if:  Your contractions become stronger, more regular, and closer together.  You have fluid leaking or gushing from your vagina.  You pass blood-tinged mucus (bloody show).  You have bleeding from your vagina.  You have low back pain that you never had before.  You feel your baby's head pushing down and causing pelvic pressure.  Your baby is not moving inside you as much as it used to. Summary  Contractions that occur before labor are   called Braxton Hicks contractions, false labor, or practice contractions.  Braxton Hicks contractions are usually shorter, weaker, farther apart, and less regular than true labor contractions. True labor contractions usually become progressively stronger and regular, and they become more frequent.  Manage discomfort from Braxton Hicks contractions  by changing position, resting in a warm bath, drinking plenty of water, or practicing deep breathing. This information is not intended to replace advice given to you by your health care provider. Make sure you discuss any questions you have with your health care provider. Document Released: 01/19/2017 Document Revised: 08/18/2017 Document Reviewed: 01/19/2017 Elsevier Patient Education  2020 Elsevier Inc.  

## 2019-06-07 NOTE — MAU Note (Signed)
I have communicated with Dr Driver  and reviewed vital signs:  Vitals:   06/07/19 1425 06/07/19 1658  BP: 124/63 106/62  Pulse: (!) 110   Resp: 16   Temp: 98.4 F (36.9 C)   SpO2: 99%     Vaginal exam:  Dilation: 1.5 Effacement (%): 50, 60 Cervical Position: Middle Station: Ballotable Presentation: Vertex Exam by:: Frances Maywood RN ,   Also reviewed contraction pattern and that non-stress test is reactive.  It has been documented that patient is contracting irregularly minutes with with no cervical change over 1 hour  not indicating active labor.  Patient denies any other complaints.  Based on this report provider has given order for discharge.  A discharge order and diagnosis entered by a provider.   Labor discharge instructions reviewed with patient.

## 2019-06-07 NOTE — Progress Notes (Signed)
S/w for mychat visit. Pt reports fetal movement with irregular contractions. Pt states that she was at the hospital yesterday due to contractions and they are closer together today coming at about 3-4 minutes apart for at least an hour, denies bleeding. Pt states that she wanted to wait until this visit until going back to the hospital.

## 2019-06-07 NOTE — Progress Notes (Signed)
   [place] VIRTUAL VIDEO VISIT ENCOUNTER NOTE  Provider location: [facility] for [facility] at [place]   I connected with Natale Milch on 06/07/19 at  9:45 AM EDT by OB MyChart Video Encounter at home and verified that I am speaking with the correct person using two identifiers.   I discussed the limitations, risks, security and privacy concerns of performing an evaluation and management service virtually and the availability of in person appointments. I also discussed with the patient that there may be a patient responsible charge related to this service. The patient expressed understanding and agreed to proceed. Subjective:  Shannon Lewis is a 23 y.o. G2P0010 at [redacted]w[redacted]d being seen today for ongoing prenatal care.  She is currently monitored for the following issues for this low-risk pregnancy and has Supervision of other normal pregnancy, antepartum and UTI (urinary tract infection) in pregnancy, antepartum on their problem list.  Patient reports no complaints.  Contractions: Irregular. Vag. Bleeding: None.  Movement: Present. Denies any leaking of fluid.   The following portions of the patient's history were reviewed and updated as appropriate: allergies, current medications, past family history, past medical history, past social history, past surgical history and problem list.   Objective:   Vitals:   06/07/19 0935  BP: (!) 138/95  Pulse: (!) 104    Fetal Status:     Movement: Present     General:  Alert, oriented and cooperative. Patient is in no acute distress.  Respiratory: Normal respiratory effort, no problems with respiration noted  Mental Status: Normal mood and affect. Normal behavior. Normal judgment and thought content.  Rest of physical exam deferred due to type of encounter  Imaging: No results found.  Assessment and Plan:  Pregnancy: G2P0010 at [redacted]w[redacted]d 1. Supervision of other normal pregnancy, antepartum   Term labor symptoms and  general obstetric precautions including but not limited to vaginal bleeding, contractions, leaking of fluid and fetal movement were reviewed in detail with the patient. I discussed the assessment and treatment plan with the patient. The patient was provided an opportunity to ask questions and all were answered. The patient agreed with the plan and demonstrated an understanding of the instructions. The patient was advised to call back or seek an in-person office evaluation/go to MAU at [facility] for any urgent or concerning symptoms. Please refer to After Visit Summary for other counseling recommendations.   I provided 10 minutes of face-to-face time during this encounter.  Return in about 1 week (around 06/14/2019) for MyChart.   Baltazar Najjar, MD Center for [facility], [place] [facility] 06/07/2019

## 2019-06-07 NOTE — MAU Note (Signed)
.   Shannon Lewis is a 23 y.o. at [redacted]w[redacted]d here in MAU reporting: worsening contractions since yesterday. States she was evaluated yesterday in MAU and was 3 cms. Denies any vaginal bleeding or LOF  Onset of complaint: couple of days Pain score: 6 Vitals:   06/07/19 1425  BP: 124/63  Pulse: (!) 110  Resp: 16  Temp: 98.4 F (36.9 C)  SpO2: 99%     FHT:130 Lab orders placed from triage: UA

## 2019-06-10 ENCOUNTER — Inpatient Hospital Stay (HOSPITAL_COMMUNITY)
Admission: AD | Admit: 2019-06-10 | Discharge: 2019-06-10 | Disposition: A | Discharge status: Home / Self Care | Payer: Medicaid Other | Attending: Obstetrics and Gynecology | Admitting: Obstetrics and Gynecology

## 2019-06-10 ENCOUNTER — Encounter (HOSPITAL_COMMUNITY): Payer: Self-pay

## 2019-06-10 ENCOUNTER — Other Ambulatory Visit: Payer: Self-pay

## 2019-06-10 DIAGNOSIS — N898 Other specified noninflammatory disorders of vagina: Secondary | ICD-10-CM | POA: Insufficient documentation

## 2019-06-10 DIAGNOSIS — O26893 Other specified pregnancy related conditions, third trimester: Secondary | ICD-10-CM | POA: Diagnosis not present

## 2019-06-10 DIAGNOSIS — Z3A37 37 weeks gestation of pregnancy: Secondary | ICD-10-CM

## 2019-06-10 NOTE — MAU Provider Note (Signed)
First Provider Initiated Contact with Patient 06/10/19 1328       S: Ms. Shannon Lewis is a 23 y.o. G2P0010 at [redacted]w[redacted]d  who presents to MAU today complaining of leaking of fluid since last night. She denies vaginal bleeding. She endorses contractions, irregular, mild. She reports normal fetal movement.    O: BP 120/68 (BP Location: Right Arm)   Pulse 92   Temp 98.2 F (36.8 C) (Oral)   Resp 18   LMP 09/11/2018 (Approximate)  GENERAL: Well-developed, well-nourished female in no acute distress.  HEAD: Normocephalic, atraumatic.  CHEST: Normal effort of breathing, regular heart rate ABDOMEN: Soft, nontender, gravid PELVIC: Normal external female genitalia. Vagina is pink and rugated. Cervix with normal contour, no lesions. Normal discharge.  Negative pooling.   Cervical exam:  Dilation: 2 Effacement (%): 50 Station: -2, -3 Presentation: Vertex Exam by:: Tomi Bamberger, PA   Fetal Monitoring: Baseline: 120 bpm Variability: moderate Accelerations: 15 x 15 Decelerations: none Contractions: irregular, few   No results found for this or any previous visit (from the past 24 hour(s)). Fern - negative   A: SIUP at [redacted]w[redacted]d  Membranes intact  P: Discharge home Labor precautions and kick counts discussed and included on AVS Patient advised to follow-up with [facility] as scheduled for routine prenatal care this week  Patient may return to MAU as needed or if her condition were to change or worsen   Luvenia Redden, PA-C 06/10/2019 1:29 PM

## 2019-06-10 NOTE — Discharge Instructions (Signed)
Fetal Movement Counts Patient Name: ________________________________________________ Patient Due Date: ____________________ What is a fetal movement count?  A fetal movement count is the number of times that you feel your baby move during a certain amount of time. This may also be called a fetal kick count. A fetal movement count is recommended for every pregnant woman. You may be asked to start counting fetal movements as early as week 28 of your pregnancy. Pay attention to when your baby is most active. You may notice your baby's sleep and wake cycles. You may also notice things that make your baby move more. You should do a fetal movement count:  When your baby is normally most active.  At the same time each day. A good time to count movements is while you are resting, after having something to eat and drink. How do I count fetal movements? 1. Find a quiet, comfortable area. Sit, or lie down on your side. 2. Write down the date, the start time and stop time, and the number of movements that you felt between those two times. Take this information with you to your health care visits. 3. For 2 hours, count kicks, flutters, swishes, rolls, and jabs. You should feel at least 10 movements during 2 hours. 4. You may stop counting after you have felt 10 movements. 5. If you do not feel 10 movements in 2 hours, have something to eat and drink. Then, keep resting and counting for 1 hour. If you feel at least 4 movements during that hour, you may stop counting. Contact a health care provider if:  You feel fewer than 4 movements in 2 hours.  Your baby is not moving like he or she usually does. Date: ____________ Start time: ____________ Stop time: ____________ Movements: ____________ Date: ____________ Start time: ____________ Stop time: ____________ Movements: ____________ Date: ____________ Start time: ____________ Stop time: ____________ Movements: ____________ Date: ____________ Start time:  ____________ Stop time: ____________ Movements: ____________ Date: ____________ Start time: ____________ Stop time: ____________ Movements: ____________ Date: ____________ Start time: ____________ Stop time: ____________ Movements: ____________ Date: ____________ Start time: ____________ Stop time: ____________ Movements: ____________ Date: ____________ Start time: ____________ Stop time: ____________ Movements: ____________ Date: ____________ Start time: ____________ Stop time: ____________ Movements: ____________ This information is not intended to replace advice given to you by your health care provider. Make sure you discuss any questions you have with your health care provider. Document Released: 10/05/2006 Document Revised: 09/25/2018 Document Reviewed: 10/15/2015 Elsevier Patient Education  2020 Elsevier Inc. Braxton Hicks Contractions Contractions of the uterus can occur throughout pregnancy, but they are not always a sign that you are in labor. You may have practice contractions called Braxton Hicks contractions. These false labor contractions are sometimes confused with true labor. What are Braxton Hicks contractions? Braxton Hicks contractions are tightening movements that occur in the muscles of the uterus before labor. Unlike true labor contractions, these contractions do not result in opening (dilation) and thinning of the cervix. Toward the end of pregnancy (32-34 weeks), Braxton Hicks contractions can happen more often and may become stronger. These contractions are sometimes difficult to tell apart from true labor because they can be very uncomfortable. You should not feel embarrassed if you go to the hospital with false labor. Sometimes, the only way to tell if you are in true labor is for your health care provider to look for changes in the cervix. The health care provider will do a physical exam and may monitor your contractions. If you   are not in true labor, the exam should show  that your cervix is not dilating and your water has not broken. If there are no other health problems associated with your pregnancy, it is completely safe for you to be sent home with false labor. You may continue to have Braxton Hicks contractions until you go into true labor. How to tell the difference between true labor and false labor True labor  Contractions last 30-70 seconds.  Contractions become very regular.  Discomfort is usually felt in the top of the uterus, and it spreads to the lower abdomen and low back.  Contractions do not go away with walking.  Contractions usually become more intense and increase in frequency.  The cervix dilates and gets thinner. False labor  Contractions are usually shorter and not as strong as true labor contractions.  Contractions are usually irregular.  Contractions are often felt in the front of the lower abdomen and in the groin.  Contractions may go away when you walk around or change positions while lying down.  Contractions get weaker and are shorter-lasting as time goes on.  The cervix usually does not dilate or become thin. Follow these instructions at home:   Take over-the-counter and prescription medicines only as told by your health care provider.  Keep up with your usual exercises and follow other instructions from your health care provider.  Eat and drink lightly if you think you are going into labor.  If Braxton Hicks contractions are making you uncomfortable: ? Change your position from lying down or resting to walking, or change from walking to resting. ? Sit and rest in a tub of warm water. ? Drink enough fluid to keep your urine pale yellow. Dehydration may cause these contractions. ? Do slow and deep breathing several times an hour.  Keep all follow-up prenatal visits as told by your health care provider. This is important. Contact a health care provider if:  You have a fever.  You have continuous pain in  your abdomen. Get help right away if:  Your contractions become stronger, more regular, and closer together.  You have fluid leaking or gushing from your vagina.  You pass blood-tinged mucus (bloody show).  You have bleeding from your vagina.  You have low back pain that you never had before.  You feel your baby's head pushing down and causing pelvic pressure.  Your baby is not moving inside you as much as it used to. Summary  Contractions that occur before labor are called Braxton Hicks contractions, false labor, or practice contractions.  Braxton Hicks contractions are usually shorter, weaker, farther apart, and less regular than true labor contractions. True labor contractions usually become progressively stronger and regular, and they become more frequent.  Manage discomfort from Braxton Hicks contractions by changing position, resting in a warm bath, drinking plenty of water, or practicing deep breathing. This information is not intended to replace advice given to you by your health care provider. Make sure you discuss any questions you have with your health care provider. Document Released: 01/19/2017 Document Revised: 08/18/2017 Document Reviewed: 01/19/2017 Elsevier Patient Education  2020 Elsevier Inc.  

## 2019-06-10 NOTE — MAU Note (Signed)
Pt presents to MAU with c/o PROM. She states she felt gush of fluid last night, since then has had light trickle. Pt denies bleeding today but did have bleeding x 3 days ago. +FM

## 2019-06-14 ENCOUNTER — Encounter: Payer: Self-pay | Admitting: Obstetrics

## 2019-06-14 ENCOUNTER — Telehealth (INDEPENDENT_AMBULATORY_CARE_PROVIDER_SITE_OTHER): Payer: Medicaid Other | Admitting: Obstetrics

## 2019-06-14 DIAGNOSIS — Z3A38 38 weeks gestation of pregnancy: Secondary | ICD-10-CM

## 2019-06-14 DIAGNOSIS — Z3483 Encounter for supervision of other normal pregnancy, third trimester: Secondary | ICD-10-CM

## 2019-06-14 DIAGNOSIS — Z3480 Encounter for supervision of other normal pregnancy, unspecified trimester: Secondary | ICD-10-CM

## 2019-06-14 NOTE — Progress Notes (Signed)
   TELEHEALTH VIRTUAL OBSTETRICS VISIT ENCOUNTER NOTE  I connected with Shannon Lewis on 06/14/19 at  9:45 AM EDT by telephone at home and verified that I am speaking with the correct person using two identifiers.   I discussed the limitations, risks, security and privacy concerns of performing an evaluation and management service by telephone and the availability of in person appointments. I also discussed with the patient that there may be a patient responsible charge related to this service. The patient expressed understanding and agreed to proceed.  Subjective:  Shannon Lewis is a 23 y.o. G2P0010 at [redacted]w[redacted]d being followed for ongoing prenatal care.  She is currently monitored for the following issues for this low-risk pregnancy and has Supervision of other normal pregnancy, antepartum and UTI (urinary tract infection) in pregnancy, antepartum on their problem list.  Patient reports heartburn. Reports fetal movement. Denies any contractions, bleeding or leaking of fluid.   The following portions of the patient's history were reviewed and updated as appropriate: allergies, current medications, past family history, past medical history, past social history, past surgical history and problem list.   Objective:   General:  Alert, oriented and cooperative.   Mental Status: Normal mood and affect perceived. Normal judgment and thought content.  Rest of physical exam deferred due to type of encounter  Assessment and Plan:  Pregnancy: G2P0010 at 104w1d 1. Supervision of other normal pregnancy, antepartum   Term labor symptoms and general obstetric precautions including but not limited to vaginal bleeding, contractions, leaking of fluid and fetal movement were reviewed in detail with the patient.  I discussed the assessment and treatment plan with the patient. The patient was provided an opportunity to ask questions and all were answered. The patient agreed with the plan and demonstrated an  understanding of the instructions. The patient was advised to call back or seek an in-person office evaluation/go to MAU at [facility] for any urgent or concerning symptoms. Please refer to After Visit Summary for other counseling recommendations.   I provided 10 minutes of non-face-to-face time during this encounter.  Return in about 1 week (around 06/21/2019) for [place].  Future Appointments  Date Time Provider [place]  06/14/2019  9:45 AM Shelly Bombard, MD [place] None    Baltazar Najjar, [place] [facility], [place] [facility] 06-14-2019

## 2019-06-20 ENCOUNTER — Ambulatory Visit (INDEPENDENT_AMBULATORY_CARE_PROVIDER_SITE_OTHER): Payer: Medicaid Other | Admitting: Certified Nurse Midwife

## 2019-06-20 ENCOUNTER — Encounter: Payer: Self-pay | Admitting: Certified Nurse Midwife

## 2019-06-20 ENCOUNTER — Other Ambulatory Visit: Payer: Self-pay

## 2019-06-20 DIAGNOSIS — Z3480 Encounter for supervision of other normal pregnancy, unspecified trimester: Secondary | ICD-10-CM

## 2019-06-20 DIAGNOSIS — Z3A39 39 weeks gestation of pregnancy: Secondary | ICD-10-CM

## 2019-06-20 DIAGNOSIS — Z3483 Encounter for supervision of other normal pregnancy, third trimester: Secondary | ICD-10-CM

## 2019-06-20 NOTE — Patient Instructions (Signed)
Reasons to go to MAU:  1.  Contractions are  5 minutes apart or less, each last 1 minute, these have been going on for 1-2 hours, and you cannot walk or talk during them 2.  You have a large gush of fluid, or a trickle of fluid that will not stop and you have to wear a pad 3.  You have bleeding that is bright red, heavier than spotting--like menstrual bleeding (spotting can be normal in early labor or after a check of your cervix) 4.  You do not feel the baby moving like he/she normally does  

## 2019-06-20 NOTE — Progress Notes (Signed)
   PRENATAL VISIT NOTE  Subjective:  Shannon Lewis is a 23 y.o. G2P0010 at [redacted]w[redacted]d being seen today for ongoing prenatal care.  She is currently monitored for the following issues for this low-risk pregnancy and has Supervision of other normal pregnancy, antepartum and UTI (urinary tract infection) in pregnancy, antepartum on their problem list.  Patient reports no complaints.  Contractions: Irregular. Vag. Bleeding: None.  Movement: Present. Denies leaking of fluid.   The following portions of the patient's history were reviewed and updated as appropriate: allergies, current medications, past family history, past medical history, past social history, past surgical history and problem list.   Objective:   Vitals:   06/20/19 1022  BP: 122/74  Pulse: 92  Weight: 163 lb (73.9 kg)    Fetal Status: Fetal Heart Rate (bpm): 150 Fundal Height: 37 cm Movement: Present  Presentation: Vertex  General:  Alert, oriented and cooperative. Patient is in no acute distress.  Skin: Skin is warm and dry. No rash noted.   Cardiovascular: Normal heart rate noted  Respiratory: Normal respiratory effort, no problems with respiration noted  Abdomen: Soft, gravid, appropriate for gestational age.  Pain/Pressure: Present     Pelvic: Cervical exam performed Dilation: 3 Effacement (%): 80 Station: -1  Extremities: Normal range of motion.  Edema: Moderate pitting, indentation subsides rapidly  Mental Status: Normal mood and affect. Normal behavior. Normal judgment and thought content.   Assessment and Plan:  Pregnancy: G2P0010 at [redacted]w[redacted]d 1. Supervision of other normal pregnancy, antepartum - Patient doing well, "ready for baby to come" - Routine prenatal care - Anticipatory guidance on upcoming appointments - Membrane sweep in office today- 3cm/80/-1  - Educated and discussed EPO, RRT and IC to induce contractions at home including miles circuit  - IOL scheduled for 10/15- orders placed  - Antenatal  screening starting next week if patient does not delivery prior   Term labor symptoms and general obstetric precautions including but not limited to vaginal bleeding, contractions, leaking of fluid and fetal movement were reviewed in detail with the patient. Please refer to After Visit Summary for other counseling recommendations.   Return in about 1 week (around 06/27/2019) for ROB, NST.  Future Appointments  Date Time Provider [place]  06/27/2019 10:15 AM Virginia Rochester, NP [place] None  07/04/2019 12:00 AM [facility] None    Lajean Manes, CNM

## 2019-06-22 ENCOUNTER — Encounter (HOSPITAL_COMMUNITY): Payer: Self-pay

## 2019-06-22 ENCOUNTER — Inpatient Hospital Stay (HOSPITAL_COMMUNITY): Payer: Medicaid Other | Admitting: Anesthesiology

## 2019-06-22 ENCOUNTER — Inpatient Hospital Stay (HOSPITAL_COMMUNITY)
Admission: AD | Admit: 2019-06-22 | Discharge: 2019-06-24 | DRG: 807 | Disposition: A | Discharge status: Home / Self Care | Payer: Medicaid Other | Attending: Obstetrics and Gynecology | Admitting: Obstetrics and Gynecology

## 2019-06-22 ENCOUNTER — Other Ambulatory Visit: Payer: Self-pay

## 2019-06-22 DIAGNOSIS — Z20828 Contact with and (suspected) exposure to other viral communicable diseases: Secondary | ICD-10-CM | POA: Diagnosis present

## 2019-06-22 DIAGNOSIS — Z87891 Personal history of nicotine dependence: Secondary | ICD-10-CM | POA: Diagnosis not present

## 2019-06-22 DIAGNOSIS — Z3480 Encounter for supervision of other normal pregnancy, unspecified trimester: Secondary | ICD-10-CM

## 2019-06-22 DIAGNOSIS — Z3A39 39 weeks gestation of pregnancy: Secondary | ICD-10-CM | POA: Diagnosis not present

## 2019-06-22 DIAGNOSIS — Z3689 Encounter for other specified antenatal screening: Secondary | ICD-10-CM

## 2019-06-22 DIAGNOSIS — O26893 Other specified pregnancy related conditions, third trimester: Secondary | ICD-10-CM | POA: Diagnosis present

## 2019-06-22 LAB — CBC
HCT: 28.7 % — ABNORMAL LOW (ref 36.0–46.0)
Hemoglobin: 9.8 g/dL — ABNORMAL LOW (ref 12.0–15.0)
MCH: 33 pg (ref 26.0–34.0)
MCHC: 34.1 g/dL (ref 30.0–36.0)
MCV: 96.6 fL (ref 80.0–100.0)
Platelets: 167 10*3/uL (ref 150–400)
RBC: 2.97 MIL/uL — ABNORMAL LOW (ref 3.87–5.11)
RDW: 14.3 % (ref 11.5–15.5)
WBC: 11 10*3/uL — ABNORMAL HIGH (ref 4.0–10.5)
nRBC: 0 % (ref 0.0–0.2)

## 2019-06-22 LAB — RPR: RPR Ser Ql: NONREACTIVE

## 2019-06-22 LAB — TYPE AND SCREEN
ABO/RH(D): A POS
Antibody Screen: NEGATIVE

## 2019-06-22 LAB — ABO/RH: ABO/RH(D): A POS

## 2019-06-22 LAB — SARS CORONAVIRUS 2 BY RT PCR (HOSPITAL ORDER, PERFORMED IN ~~LOC~~ HOSPITAL LAB): SARS Coronavirus 2: NEGATIVE

## 2019-06-22 MED ORDER — LACTATED RINGERS IV SOLN
500.0000 mL | Freq: Once | INTRAVENOUS | Status: AC
  Administered 2019-06-22: 23:00:00 500 mL via INTRAVENOUS

## 2019-06-22 MED ORDER — DIPHENHYDRAMINE HCL 50 MG/ML IJ SOLN: 12.5000 mg | INTRAMUSCULAR | Status: DC | PRN

## 2019-06-22 MED ORDER — FENTANYL CITRATE (PF) 100 MCG/2ML IJ SOLN
100.0000 ug | INTRAMUSCULAR | Status: DC | PRN
  Administered 2019-06-22 (×2): 100 ug via INTRAVENOUS
  Filled 2019-06-22 (×2): qty 2

## 2019-06-22 MED ORDER — FLEET ENEMA 7-19 GM/118ML RE ENEM: 1.0000 | ENEMA | RECTAL | Status: DC | PRN

## 2019-06-22 MED ORDER — EPHEDRINE 5 MG/ML INJ: 10.0000 mg | INTRAVENOUS | Status: DC | PRN

## 2019-06-22 MED ORDER — OXYTOCIN BOLUS FROM INFUSION
500.0000 mL | Freq: Once | INTRAVENOUS | Status: AC
  Administered 2019-06-23: 500 mL via INTRAVENOUS

## 2019-06-22 MED ORDER — TERBUTALINE SULFATE 1 MG/ML IJ SOLN: 0.2500 mg | Freq: Once | INTRAMUSCULAR | Status: DC | PRN

## 2019-06-22 MED ORDER — ONDANSETRON HCL 4 MG/2ML IJ SOLN
4.0000 mg | Freq: Four times a day (QID) | INTRAMUSCULAR | Status: DC | PRN
  Administered 2019-06-22: 4 mg via INTRAVENOUS
  Filled 2019-06-22: qty 2

## 2019-06-22 MED ORDER — OXYTOCIN 40 UNITS IN NORMAL SALINE INFUSION - SIMPLE MED
2.5000 [IU]/h | INTRAVENOUS | Status: DC
  Administered 2019-06-23: 01:00:00 2.5 [IU]/h via INTRAVENOUS

## 2019-06-22 MED ORDER — LIDOCAINE HCL (PF) 1 % IJ SOLN: 30.0000 mL | INTRAMUSCULAR | Status: DC | PRN

## 2019-06-22 MED ORDER — OXYCODONE-ACETAMINOPHEN 5-325 MG PO TABS: 2.0000 | ORAL_TABLET | ORAL | Status: DC | PRN

## 2019-06-22 MED ORDER — SOD CITRATE-CITRIC ACID 500-334 MG/5ML PO SOLN: 30.0000 mL | ORAL | Status: DC | PRN

## 2019-06-22 MED ORDER — FENTANYL-BUPIVACAINE-NACL 0.5-0.125-0.9 MG/250ML-% EP SOLN
12.0000 mL/h | EPIDURAL | Status: DC | PRN
  Filled 2019-06-22: qty 250

## 2019-06-22 MED ORDER — LACTATED RINGERS IV SOLN
INTRAVENOUS | Status: DC
  Administered 2019-06-22 (×4): via INTRAVENOUS

## 2019-06-22 MED ORDER — LACTATED RINGERS IV SOLN: 500.0000 mL | INTRAVENOUS | Status: DC | PRN

## 2019-06-22 MED ORDER — OXYCODONE-ACETAMINOPHEN 5-325 MG PO TABS: 1.0000 | ORAL_TABLET | ORAL | Status: DC | PRN

## 2019-06-22 MED ORDER — PHENYLEPHRINE 40 MCG/ML (10ML) SYRINGE FOR IV PUSH (FOR BLOOD PRESSURE SUPPORT): 80.0000 ug | PREFILLED_SYRINGE | INTRAVENOUS | Status: DC | PRN

## 2019-06-22 MED ORDER — ACETAMINOPHEN 325 MG PO TABS: 650.0000 mg | ORAL_TABLET | ORAL | Status: DC | PRN

## 2019-06-22 MED ORDER — OXYTOCIN 40 UNITS IN NORMAL SALINE INFUSION - SIMPLE MED
1.0000 m[IU]/min | INTRAVENOUS | Status: DC
  Administered 2019-06-22: 21:00:00 22 m[IU]/min via INTRAVENOUS
  Administered 2019-06-22: 13:00:00 2 m[IU]/min via INTRAVENOUS
  Administered 2019-06-22: 20:00:00 18 m[IU]/min via INTRAVENOUS
  Administered 2019-06-22: 20:00:00 20 m[IU]/min via INTRAVENOUS
  Administered 2019-06-22: 21:00:00 24 m[IU]/min via INTRAVENOUS
  Filled 2019-06-22: qty 1000

## 2019-06-22 NOTE — Progress Notes (Signed)
Pt requested something to eat prior to starting pitocin. Spoke with Chrys Racer for order request. Pt may have something to eat prior to augmentation. She will do BS [place] to reconfirm vertex presentation when ready.

## 2019-06-22 NOTE — MAU Note (Signed)
Shannon Lewis is a 23 y.o. at [redacted]w[redacted]d here in MAU reporting: LOF since midnight, fluid was clear. Pt states she is still trickling and its "a heavy trickle". Having some intermittent contractions. Decreased fetal movement, has felt some but less than normal. No recent IC  Onset of complaint: midnight  Pain score: 5/10  Vitals:   06/22/19 0826  BP: 124/72  Pulse: 95  Resp: 17  Temp: 98.1 F (36.7 C)  SpO2: 100%     FHT: 147  Lab orders placed from triage: none

## 2019-06-22 NOTE — Progress Notes (Signed)
Labor Progress Note Shannon Lewis is a 23 y.o. G2P0010 at [redacted]w[redacted]d presented for SROM  S:  Patient comfortable  O:  BP 140/83   Pulse 69   Temp 98.1 F (36.7 C) (Oral)   Resp 18   Ht 5\' 3"  (1.6 m)   Wt 74.2 kg   LMP 09/11/2018 (Approximate)   SpO2 100%   BMI 28.98 kg/m   Fetal Tracing:  Baseline: 125 Variability: moderate Accels: 15x15 Decels: none  Toco: 1-2   CVE: Dilation: 3 Effacement (%): 90 Cervical Position: Middle Station: -1 Presentation: Vertex Exam by:: Len Blalock CNM   A&P: 23 y.o. G2P0010 [redacted]w[redacted]d SROM #Labor: Continue pitocin #Pain: per patient request #FWB: Cat 1 #GBS negative  Wende Mott, CNM 8:00 PM

## 2019-06-22 NOTE — Progress Notes (Signed)
LABOR PROGRESS NOTE  Shannon Lewis is a 23 y.o. G2P0010 at [redacted]w[redacted]d  admitted for SROM  Subjective: Patient more uncomfortable. Requesting epidural.  Objective: BP 132/77   Pulse 78   Temp 98.9 F (37.2 C) (Oral)   Resp 18   Ht 5\' 3"  (1.6 m)   Wt 74.2 kg   LMP 09/11/2018 (Approximate)   SpO2 100%   BMI 28.98 kg/m  or  Vitals:   06/22/19 2031 06/22/19 2101 06/22/19 2132 06/22/19 2231  BP: 110/62 114/65 129/71 132/77  Pulse: 82 79 81 78  Resp: 16 16 16 18   Temp:   98.9 F (37.2 C)   TempSrc:   Oral   SpO2:      Weight:      Height:        Dilation: 6 Effacement (%): 90 Cervical Position: Anterior Station: 0 Presentation: Vertex Exam by:: Army Fossa RN FHT: baseline rate 120, moderate varibility, + acel, few early decel Toco: 1-3 mins  Labs: Lab Results  Component Value Date   WBC 11.0 (H) 06/22/2019   HGB 9.8 (L) 06/22/2019   HCT 28.7 (L) 06/22/2019   MCV 96.6 06/22/2019   PLT 167 06/22/2019    Patient Active Problem List   Diagnosis Date Noted  . Indication for care in labor or delivery 06/22/2019  . UTI (urinary tract infection) in pregnancy, antepartum 01/01/2019  . Supervision of other normal pregnancy, antepartum 12/11/2018    Assessment / Plan: 23 y.o. G2P0010 at [redacted]w[redacted]d here for SROM. Progressing well with Pitocin.  Labor: continue pitocin  Fetal Wellbeing:  Cat I Pain Control:  Getting Epidural now Anticipated MOD:  SVD  Mina Marble, D.O. [place] Resident, PGY2 06/22/2019, 11:20 PM

## 2019-06-22 NOTE — H&P (Signed)
OBSTETRIC ADMISSION HISTORY AND PHYSICAL  Shannon Lewis is a 23 y.o. female G2P0010 with IUP at [redacted]w[redacted]d by LMp presenting for SROM at midnight. She reports +FMs, no VB, no blurry vision, headaches or peripheral edema, and RUQ pain.  She plans on breast feeding. She request POP for birth control. She received her prenatal [facility] at [facility]   Dating: By LMP --->  Estimated Date of Delivery: 06/27/19    Nursing Staff Provider  Office Location  [facility] Dating   LMP  Language   english  Anatomy US  01-23-19  Flu Vaccine   declined 06/20/19 Genetic Screen  NIPS:   AFP:   First Screen:  Quad:    TDaP vaccine   info given 04-02-19 Hgb A1C or  GTT Early  Third trimester normal 28 weeks  Rhogam     LAB RESULTS   Feeding Plan  Try breast first Blood Type A/Positive/-- (03/24 1438)   Contraception  pill Antibody Negative (03/24 1438)  Circumcision  yes if female Rubella 1.26 (03/24 1438)  Pediatrician  Can't remember name RPR Non Reactive (07/14 0935)   Support Person  Mom Aram Beecham) HBsAg Negative (03/24 1438)   Prenatal Classes  planning to take HIV Non Reactive (07/14 0935)  BTL Consent  GBS --Theda Sers (09/11 0854)(For PCN allergy, check sensitivities)   VBAC Consent  Pap     Hgb Electro      CF     SMA     Waterbirth  [ ]  Class [ ]  Consent [ ]  CNM visit   Prenatal History/Complications:  Past Medical History: Past Medical History:  Diagnosis Date  . Anemia   . Infection    UTI  . MRSA (methicillin resistant Staphylococcus aureus) infection 2015    Past Surgical History: Past Surgical History:  Procedure Laterality Date  . NO PAST SURGERIES      Obstetrical History: OB History    Gravida  2   Para      Term      Preterm      AB  1   Living        SAB      TAB  1   Ectopic      Multiple      Live Births              Social History Social History   Socioeconomic History  . Marital status: Single    Spouse name: Not on file  . Number of children: Not  on file  . Years of education: Not on file  . Highest education level: Not on file  Occupational History  . Not on file  Social Needs  . Financial resource strain: Not on file  . Food insecurity    Worry: Not on file    Inability: Not on file  . Transportation needs    Medical: Not on file    Non-medical: Not on file  Tobacco Use  . Smoking status: Former Smoker    Types: Cigarettes  . Smokeless tobacco: Never Used  . Tobacco comment: quit in March  Substance and Sexual Activity  . Alcohol use: Never    Frequency: Never  . Drug use: Never  . Sexual activity: Yes    Birth control/protection: None  Lifestyle  . Physical activity    Days per week: Not on file    Minutes per session: Not on file  . Stress: Not on file  Relationships  . Social connections  Talks on phone: Not on file    Gets together: Not on file    Attends religious service: Not on file    Active member of club or organization: Not on file    Attends meetings of clubs or organizations: Not on file    Relationship status: Not on file  Other Topics Concern  . Not on file  Social History Narrative  . Not on file    Family History: Family History  Problem Relation Age of Onset  . COPD Mother   . Asthma Mother   . Asthma Father   . COPD Father   . Hypertension Father     Allergies: No Known Allergies  Medications Prior to Admission  Medication Sig Dispense Refill Last Dose  . ferrous sulfate 325 (65 FE) MG tablet Take 1 tablet (325 mg total) by mouth 2 (two) times daily with a meal. 60 tablet 5   . Prenat w/o A-FeCbn-Meth-FA-DHA (PRENATE MINI) 29-0.6-0.4-350 MG CAPS Take 1 capsule by mouth daily before breakfast. 90 capsule 4   . Prenatal Vit-Fe Fumarate-FA (PRENATAL MULTIVITAMIN) TABS tablet Take 1 tablet by mouth daily at 12 noon.        Review of Systems   All systems reviewed and negative except as stated in HPI  Blood pressure 130/82, pulse 94, temperature 98.1 F (36.7 C),  temperature source Oral, resp. rate 17, height 5\' 3"  (1.6 m), weight 74.2 kg, last menstrual period 09/11/2018, SpO2 100 %. General appearance: alert and cooperative Lungs: clear to auscultation bilaterally Heart: regular rate and rhythm Abdomen: soft, non-tender; bowel sounds normal Pelvic: n/a Extremities: Homans sign is negative, no sign of DVT DTR's +2 Presentation: cephalic Fetal monitoringBaseline: 130 bpm, Variability: Good {> 6 bpm), Accelerations: Reactive and Decelerations: Absent Uterine activity: occasional uc's     Prenatal labs: ABO, Rh: --/--/PENDING (10/03 40980922) Antibody: PENDING (10/03 11910922) Rubella: 1.26 (03/24 1438) RPR: Non Reactive (07/14 0935)  HBsAg: Negative (03/24 1438)  HIV: Non Reactive (07/14 0935)  GBS: --Theda Sers/Negative (09/11 [id number])   Prenatal Transfer Tool  Maternal Diabetes: No Genetic Screening: Declined Maternal Ultrasounds/Referrals: Normal Fetal Ultrasounds or other Referrals:  None Maternal Substance Abuse:  No Significant Maternal Medications:  None Significant Maternal Lab Results: Group B Strep negative  Results for orders placed or performed during the hospital encounter of 06/22/19 (from the past 24 hour(s))  SARS Coronavirus 2 [facility](Hospital order, Performed [facility]) Nasopharyngeal Nasopharyngeal Swab   Collection Time: 06/22/19  8:51 AM   Specimen: Nasopharyngeal Swab  Result Value Ref Range   SARS Coronavirus 2 NEGATIVE NEGATIVE  CBC   Collection Time: 06/22/19  9:02 AM  Result Value Ref Range   WBC 11.0 (H) 4.0 - 10.5 K/uL   RBC 2.97 (L) 3.87 - 5.11 MIL/uL   Hemoglobin 9.8 (L) 12.0 - 15.0 g/dL   HCT 95.628.7 (L) 21.336.0 - 08.646.0 %   MCV 96.6 80.0 - 100.0 fL   MCH 33.0 26.0 - 34.0 pg   MCHC 34.1 30.0 - 36.0 g/dL   RDW 57.814.3 46.911.5 - 62.915.5 %   Platelets 167 150 - 400 K/uL   nRBC 0.0 0.0 - 0.2 %  Type and screen   Collection Time: 06/22/19  9:22 AM  Result Value Ref Range   ABO/RH(D) PENDING    Antibody Screen PENDING     Sample Expiration      06/25/2019,2359 Performed at [facility], [address] [place], [place] [address]  Patient Active Problem List   Diagnosis Date Noted  . Indication for care in labor or delivery 06/22/2019  . UTI (urinary tract infection) in pregnancy, antepartum 01/01/2019  . Supervision of other normal pregnancy, antepartum 12/11/2018    Assessment/Plan:  KATHELYN GOMBOS is a 23 y.o. G2P0010 at [redacted]w[redacted]d here for SROM  #Labor: patient desires augmentation. Will start pitocin 2x2 #Pain: Per patient request #FWB:  Cat 1 #ID:  GBS neg #MOF: Breast #MOC: Discussed at length options. Will let CNM know what she decides #Circ: Grady, CNM  06/22/2019, 10:27 AM

## 2019-06-22 NOTE — MAU Provider Note (Signed)
Pt informed that the ultrasound is considered a limited OB ultrasound and is not intended to be a complete ultrasound exam.  Patient also informed that the ultrasound is not being completed with the intent of assessing for fetal or placental anomalies or any pelvic abnormalities.  Explained that the purpose of today's ultrasound is to assess for  presentation (VTX).  Patient acknowledges the purpose of the exam and the limitations of the study.    Deavon Podgorski, Gerrie Nordmann, NP  8:50 AM 06/22/2019

## 2019-06-22 NOTE — Progress Notes (Signed)
Labor Progress Note Shannon Lewis is a 23 y.o. G2P0010 at [redacted]w[redacted]d presented for SROM  S:  Patient comfortable, states the contractions feel the same  O:  BP 119/61 (BP Location: Right Arm)   Pulse 84   Temp 98.4 F (36.9 C) (Oral)   Resp 16   Ht 5\' 3"  (1.6 m)   Wt 74.2 kg   LMP 09/11/2018 (Approximate)   SpO2 100%   BMI 28.98 kg/m   Fetal Tracing:  Baseline: 125 Variability: moderate Accels: 15x15 Decels: none  Toco: 7-10   CVE: Dilation: 3 Effacement (%): 80 Cervical Position: Middle Station: -1 Presentation: Vertex(Bedside US ) Exam by:: Maryruth Hancock, CNM   A&P: 23 y.o. G2P0010 [redacted]w[redacted]d SROM #Labor: Doing well. Pit at 55mu/min. Will recheck with change in patient status #Pain: per patient request #FWB: Cat 1 #GBS negative  Wende Mott, CNM 3:31 PM

## 2019-06-23 ENCOUNTER — Encounter (HOSPITAL_COMMUNITY): Payer: Self-pay

## 2019-06-23 DIAGNOSIS — Z3A39 39 weeks gestation of pregnancy: Secondary | ICD-10-CM

## 2019-06-23 MED ORDER — WITCH HAZEL-GLYCERIN EX PADS: 1.0000 "application " | MEDICATED_PAD | CUTANEOUS | Status: DC | PRN

## 2019-06-23 MED ORDER — ACETAMINOPHEN 325 MG PO TABS: 650.0000 mg | ORAL_TABLET | ORAL | Status: DC | PRN

## 2019-06-23 MED ORDER — BENZOCAINE-MENTHOL 20-0.5 % EX AERO: 1.0000 "application " | INHALATION_SPRAY | CUTANEOUS | Status: DC | PRN

## 2019-06-23 MED ORDER — TETANUS-DIPHTH-ACELL PERTUSSIS 5-2.5-18.5 LF-MCG/0.5 IM SUSP: 0.5000 mL | Freq: Once | INTRAMUSCULAR | Status: DC

## 2019-06-23 MED ORDER — IBUPROFEN 600 MG PO TABS
600.0000 mg | ORAL_TABLET | Freq: Four times a day (QID) | ORAL | Status: DC
  Administered 2019-06-23 – 2019-06-24 (×5): 600 mg via ORAL
  Filled 2019-06-23 (×5): qty 1

## 2019-06-23 MED ORDER — SENNOSIDES-DOCUSATE SODIUM 8.6-50 MG PO TABS
2.0000 | ORAL_TABLET | ORAL | Status: DC
  Administered 2019-06-23: 2 via ORAL
  Filled 2019-06-23: qty 2

## 2019-06-23 MED ORDER — SODIUM CHLORIDE (PF) 0.9 % IJ SOLN
INTRAMUSCULAR | Status: DC | PRN
  Administered 2019-06-22: 12 mL/h via EPIDURAL

## 2019-06-23 MED ORDER — ONDANSETRON HCL 4 MG PO TABS: 4.0000 mg | ORAL_TABLET | ORAL | Status: DC | PRN

## 2019-06-23 MED ORDER — ONDANSETRON HCL 4 MG/2ML IJ SOLN: 4.0000 mg | INTRAMUSCULAR | Status: DC | PRN

## 2019-06-23 MED ORDER — SIMETHICONE 80 MG PO CHEW: 80.0000 mg | CHEWABLE_TABLET | ORAL | Status: DC | PRN

## 2019-06-23 MED ORDER — DIBUCAINE (PERIANAL) 1 % EX OINT: 1.0000 "application " | TOPICAL_OINTMENT | CUTANEOUS | Status: DC | PRN

## 2019-06-23 MED ORDER — DIPHENHYDRAMINE HCL 25 MG PO CAPS: 25.0000 mg | ORAL_CAPSULE | Freq: Four times a day (QID) | ORAL | Status: DC | PRN

## 2019-06-23 MED ORDER — PRENATAL MULTIVITAMIN CH
1.0000 | ORAL_TABLET | Freq: Every day | ORAL | Status: DC
  Administered 2019-06-23: 12:00:00 1 via ORAL
  Filled 2019-06-23: qty 1

## 2019-06-23 MED ORDER — COCONUT OIL OIL: 1.0000 "application " | TOPICAL_OIL | Status: DC | PRN

## 2019-06-23 MED ORDER — LIDOCAINE HCL (PF) 1 % IJ SOLN
INTRAMUSCULAR | Status: DC | PRN
  Administered 2019-06-22: 2 mL via EPIDURAL
  Administered 2019-06-22: 10 mL via EPIDURAL

## 2019-06-23 NOTE — Progress Notes (Signed)
POSTPARTUM PROGRESS NOTE  Post Partum Day 1  Subjective:  Shannon Lewis is a 23 y.o. G2P1011 s/p SVD at [redacted]w[redacted]d.  She reports she is doing well. No acute events overnight. She denies any problems with ambulating, voiding or po intake. Denies nausea or vomiting.  Pain is well controlled.  Lochia is appropriate.  Objective: Blood pressure 131/80, pulse (!) 56, temperature 98 F (36.7 C), resp. rate 16, height 5\' 3"  (1.6 m), weight 74.2 kg, last menstrual period 09/11/2018, SpO2 100 %, unknown if currently breastfeeding.  Physical Exam:  General: alert, cooperative and no distress Chest: no respiratory distress Heart:regular rate, distal pulses intact Abdomen: soft, nontender,  Uterine Fundus: firm, appropriately tender DVT Evaluation: No calf swelling or tenderness Extremities: No LE edema Skin: warm, dry  Recent Labs    06/22/19 0902  HGB 9.8*  HCT 28.7*    Assessment/Plan: Shannon Lewis is a 23 y.o. G2P1011 s/p SVD at [redacted]w[redacted]d   PPD#1 - Doing well  Routine postpartum care Contraception: Breast Feeding: POPs Dispo: Plan for discharge likely PPD#2. Having some trouble with breastfeeding and will continue to work with lactation.    LOS: 1 day   Phill Myron, D.O. OB Fellow  06/23/2019, 1:43 PM

## 2019-06-23 NOTE — Anesthesia Postprocedure Evaluation (Signed)
Anesthesia Post Note  Patient: DESIRAYE ROLFSON  Procedure(s) Performed: AN AD HOC LABOR EPIDURAL     Patient location during evaluation: Mother Baby Anesthesia Type: Epidural Level of consciousness: awake and alert and oriented Pain management: satisfactory to patient Vital Signs Assessment: post-procedure vital signs reviewed and stable Respiratory status: respiratory function stable Cardiovascular status: stable Postop Assessment: no headache, no backache, epidural receding, patient able to bend at knees, no signs of nausea or vomiting and adequate PO intake Anesthetic complications: no    Last Vitals:  Vitals:   06/23/19 0245 06/23/19 0745  BP: 125/72 128/75  Pulse: 80   Resp: 18 16  Temp: 36.6 C 36.8 C  SpO2: 100% 100%    Last Pain:  Vitals:   06/23/19 0745  TempSrc: Oral  PainSc: 0-No pain   Pain Goal: Patients Stated Pain Goal: 8 (06/22/19 1102)                 Ithan Touhey

## 2019-06-23 NOTE — Discharge Summary (Addendum)
Obstetrics Discharge Summary OB/GYN Faculty Practice   Patient Name: Shannon Lewis DOB: Aug 24, 1996 MRN: [id number]  Date of admission: 06/22/2019 Delivering MD: Fatima Blank A   Date of discharge: 06/24/2019  Admitting diagnosis: watter broke Intrauterine pregnancy: [redacted]w[redacted]d     Secondary diagnosis:   Active Problems:   Indication for care in labor or delivery      Discharge diagnosis: Term Pregnancy Delivered                                            Postpartum procedures: None  Complications: None  Outpatient Follow-Up: [ ]   Continue to discuss contraceptive options. Discharged with [place] Hospital course: Shannon Lewis is a 23 y.o. [redacted]w[redacted]d who was admitted for SROM. Her pregnancy was uncomplicated. Her labor course was notable for normal progression with pitocin. Delivery was uncomplicated. Please see delivery/op note for additional details. Her postpartum course was uncomplicated. She was breastfeeding without difficulty. By day of discharge, she was passing flatus, urinating, eating and drinking without difficulty. Her pain was well-controlled, and she was discharged home with PRN Ibuprofen. She will follow-up in clinic in 4-6 weeks.   Physical exam  Vitals:   06/23/19 1151 06/23/19 1402 06/23/19 2111 06/24/19 0633  BP: 131/80 127/72 132/75 121/67  Pulse: (!) 56 (!) 55 67 61  Resp: 16 17 16 18   Temp: 98 F (36.7 C) 98.2 F (36.8 C) 98 F (36.7 C) 97.8 F (36.6 C)  TempSrc:  Oral Oral Oral  SpO2:  100% 99% 99%  Weight:      Height:       General: Well appearing, lying comfortably with baby on chest Lochia: appropriate Uterine Fundus: firm Incision: N/A DVT Evaluation: No evidence of DVT seen on physical exam. Labs: Lab Results  Component Value Date   WBC 11.0 (H) 06/22/2019   HGB 9.8 (L) 06/22/2019   HCT 28.7 (L) 06/22/2019   MCV 96.6 06/22/2019   PLT 167 06/22/2019   No flowsheet data found.  Discharge instructions: Per After Visit Summary and  "Baby and Me Booklet"  After visit meds:  Allergies as of 06/24/2019   No Known Allergies     Medication List    TAKE these medications   ferrous sulfate 325 (65 FE) MG tablet Take 1 tablet (325 mg total) by mouth 2 (two) times daily with a meal.   ibuprofen 600 MG tablet Commonly known as: ADVIL Take 1 tablet (600 mg total) by mouth every 6 (six) hours.   norethindrone 0.35 MG tablet Commonly known as: Ortho Micronor Take 1 tablet (0.35 mg total) by mouth daily.   prenatal multivitamin Tabs tablet Take 1 tablet by mouth daily at 12 noon.   Prenate Mini 29-0.6-0.4-350 MG Caps Take 1 capsule by mouth daily before breakfast.   senna-docusate 8.6-50 MG tablet Commonly known as: Senokot-S Take 2 tablets by mouth daily. Start taking on: June 25, 2019   Tdap 5-2.5-18.5 LF-MCG/0.5 injection Commonly known as: BOOSTRIX Inject 0.5 mLs into the muscle once for 1 dose.       Postpartum contraception: Progesterone only pills Diet: Routine Diet Activity: Advance as tolerated. Pelvic rest for 6 weeks.   Follow-up Appt: Future Appointments  Date Time Provider [place]  06/27/2019 10:15 AM Virginia Rochester, NP [place] None   Follow-up Visit:No follow-ups on file.   PP visit sent to [facility] on 06/23/19: Please schedule this patient for PP visit in: 6 weeks Low risk pregnancy complicated by: n/a Delivery mode:  SVD Anticipated Birth Control:  POPs PP Procedures needed: n/a  Schedule Integrated BH visit: no Provider: Any provider   Newborn Data: Live born female  Birth Weight:   APGAR: 8, 9  Newborn Delivery   Birth date/time: 06/23/2019 00:43:00 Delivery type: Vaginal, Spontaneous      Baby Feeding: Bottle and Breast Disposition:home with mother  Orpah Cobb, DO [facility] Resident, PGY2  I confirm that I have verified the information documented in the resident's note and that I have also personally reperformed the history, physical  exam and all medical decision making activities of this service and have verified that all service and findings are accurately documented in this student's note.   Rolm Bookbinder, [place] 06/24/2019 8:47 AM

## 2019-06-23 NOTE — Lactation Note (Signed)
This note was copied from a baby's chart. Lactation Consultation Note Baby 4 hrs old. Mom raised her head up when [place] peaked in rm. Baby sleeping. Mom states she tried to feed baby but baby was to sleepy. Encouraged to call for assistance as needed. Mom states she can express colostrum. Encouraged STS and I&O. Lactation brochure given. Lactation will f/u when baby interested in feeding.  Patient Name: Shannon Lewis [id number] Date: 06/23/2019     Maternal Data    Feeding Feeding Type: Breast Fed  LATCH Score Latch: Repeated attempts needed to sustain latch, nipple held in mouth throughout feeding, stimulation needed to elicit sucking reflex.  Audible Swallowing: A few with stimulation  Type of Nipple: Everted at rest and after stimulation  Comfort (Breast/Nipple): Soft / non-tender  Hold (Positioning): Assistance needed to correctly position infant at breast and maintain latch.  LATCH Score: 7  Interventions    Lactation Tools Discussed/Used     Consult Status      Panagiota Perfetti, [place] 06/23/2019, 4:47 AM

## 2019-06-23 NOTE — Lactation Note (Signed)
This note was copied from a baby's chart. Lactation Consultation Note  Patient Name: Shannon Lewis [id number] Date: 06/23/2019 Reason for consult: Initial assessment;Primapara;1st time breastfeeding;Term  21 hours old FT female who is being exclusively BF by her mother, she's a P1. Mom reported (+) breast changes during the pregnancy, and she's already familiar with hand expression. When [facility] assisted with hand expression mom's colostrum was just pouring, she had several big drops.  Baby was having her bath when entering the room, offered assistance with latch and mom agreed to try again, she said she's been having a hard time trying to latch her on. LC took baby STS to mom's right breast in football hold but baby wasn't ready, she wouldn't open her mouth and once she did it wasn't wide enough, she was very sleepy. LC hand express a few drops but baby not interesting in feeding right now. An attempt was documented in [place]. Reviewed normal newborn behavior, cluster feeding and feeding cues.  Feeding plan:  1. Encouraged mom to feed baby STS 8-12 times/24 hours or sooner if feeding cues are present 2. Hand expression and spoon feeding was also encouraged, especially if baby is still having difficulty latching on  BF brochure, BF resources and feeding diary were reviewed. Parents reported all questions and concerns were answered, they're both aware of [facility] OP services and will call PRN.  Maternal Data Formula Feeding for Exclusion: Yes Reason for exclusion: Mother's choice to formula and breast feed on admission Has patient been taught Hand Expression?: Yes Does the patient have breastfeeding experience prior to this delivery?: No  Feeding Feeding Type: Breast Fed  LATCH Score                   Interventions Interventions: Breast feeding basics reviewed;Assisted with latch;Skin to skin;Breast massage;Hand express;Breast compression;Adjust position;Support  pillows  Lactation Tools Discussed/Used WIC Program: No(but plans to sign up)   Consult Status Consult Status: Follow-up Date: 06/24/19 Follow-up type: In-patient    Halei Hanover Francene Boyers 06/23/2019, 3:34 PM

## 2019-06-23 NOTE — Anesthesia Procedure Notes (Addendum)
Epidural Patient location during procedure: OB Start time: 06/22/2019 11:10 PM End time: 06/22/2019 11:30 PM  Staffing Anesthesiologist: Pervis Hocking, DO Performed: anesthesiologist   Preanesthetic Checklist Completed: patient identified, pre-op evaluation, timeout performed, IV checked, risks and benefits discussed and monitors and equipment checked  Epidural Patient position: sitting Prep: site prepped and draped and DuraPrep Patient monitoring: continuous pulse ox, blood pressure, heart rate and cardiac monitor Approach: midline Location: L3-L4 Injection technique: LOR air  Needle:  Needle type: Tuohy  Needle gauge: 17 G Needle length: 9 cm Needle insertion depth: 5 cm Catheter type: closed end flexible Catheter size: 19 Gauge Catheter at skin depth: 10 cm Test dose: negative  Assessment Sensory level: T8 Events: blood not aspirated, injection not painful, no injection resistance, negative IV test and no paresthesia  Additional Notes 2 attempts: 1st attempt with dural nick, slow CSF leak. 2nd attempt one level above, no issues. Reason for block:procedure for pain

## 2019-06-23 NOTE — Anesthesia Preprocedure Evaluation (Signed)
Anesthesia Evaluation  Patient identified by MRN, date of birth, ID band Patient awake    Reviewed: Allergy & Precautions, NPO status , Patient's Chart, lab work & pertinent test results  Airway Mallampati: II  TM Distance: >3 FB Neck ROM: Full    Dental no notable dental hx.    Pulmonary former smoker,    Pulmonary exam normal breath sounds clear to auscultation       Cardiovascular negative cardio ROS Normal cardiovascular exam Rhythm:Regular Rate:Normal     Neuro/Psych negative neurological ROS  negative psych ROS   GI/Hepatic negative GI ROS, Neg liver ROS,   Endo/Other  negative endocrine ROS  Renal/GU negative Renal ROS  negative genitourinary   Musculoskeletal negative musculoskeletal ROS (+)   Abdominal   Peds negative pediatric ROS (+)  Hematology  (+) anemia ,   Anesthesia Other Findings   Reproductive/Obstetrics (+) Pregnancy                             Anesthesia Physical Anesthesia Plan  ASA: II  Anesthesia Plan: Epidural   Post-op Pain Management:    Induction:   PONV Risk Score and Plan: 2  Airway Management Planned: Natural Airway  Additional Equipment: None  Intra-op Plan:   Post-operative Plan:   Informed Consent: I have reviewed the patients History and Physical, chart, labs and discussed the procedure including the risks, benefits and alternatives for the proposed anesthesia with the patient or authorized representative who has indicated his/her understanding and acceptance.       Plan Discussed with:   Anesthesia Plan Comments:         Anesthesia Quick Evaluation

## 2019-06-24 MED ORDER — TETANUS-DIPHTH-ACELL PERTUSSIS 5-2.5-18.5 LF-MCG/0.5 IM SUSP: 0.5000 mL | Freq: Once | INTRAMUSCULAR | 0 refills | Status: AC

## 2019-06-24 MED ORDER — SENNOSIDES-DOCUSATE SODIUM 8.6-50 MG PO TABS: 2.0000 | ORAL_TABLET | ORAL | 0 refills | Status: DC

## 2019-06-24 MED ORDER — IBUPROFEN 600 MG PO TABS: 600.0000 mg | ORAL_TABLET | Freq: Four times a day (QID) | ORAL | 0 refills | Status: DC

## 2019-06-24 MED ORDER — NORETHINDRONE 0.35 MG PO TABS: 1.0000 | ORAL_TABLET | Freq: Every day | ORAL | 11 refills | Status: DC

## 2019-06-24 NOTE — Lactation Note (Signed)
This note was copied from a baby's chart. Lactation Consultation Note  Patient Name: Shannon Lewis Today's Date: 06/24/2019   P1, Baby 13 hours old and has not been successful at latching. Mother's nipples are evert and compressible.  Mother states baby will not suck. Mother has been pumping and giving volume to baby with finger and syringe. Undressed baby and placed her STS.   Attempted latching in football hold with no success. Applied #20NS and baby latched and sustained latch for at least 10 min. Discussed prefilling NS with colostrum to stimulate sucking. Mother has single electric pump at home and manual pump. Discussed pumping q 3 hours if baby continues to not latch. Reviewed volume guideline for infant's age and suggest using slow flow nipple once home. Also suggest making OP appt at [facility] to assist w/ latching.  Feed on demand with cues.  Goal 8-12+ times per day after first 24 hrs.  Place baby STS if not cueing.  Reviewed engorgement care and monitoring voids/stools.       Maternal Data    Feeding Feeding Type: Breast Milk  LATCH Score                   Interventions    Lactation Tools Discussed/Used     Consult Status      Carlye Grippe 06/24/2019, 12:03 PM

## 2019-06-24 NOTE — Discharge Instructions (Signed)

## 2019-06-27 ENCOUNTER — Encounter: Payer: Medicaid Other | Admitting: Nurse Practitioner

## 2019-07-04 ENCOUNTER — Inpatient Hospital Stay (HOSPITAL_COMMUNITY)
Admission: AD | Admit: 2019-07-04 | Payer: Medicaid Other | Source: Home / Self Care | Admitting: Obstetrics and Gynecology

## 2019-07-04 ENCOUNTER — Inpatient Hospital Stay (HOSPITAL_COMMUNITY): Payer: Medicaid Other

## 2019-07-26 ENCOUNTER — Telehealth: Payer: Medicaid Other | Admitting: Medical

## 2019-08-06 ENCOUNTER — Telehealth: Payer: Self-pay | Admitting: Medical

## 2020-03-01 ENCOUNTER — Encounter (HOSPITAL_COMMUNITY): Payer: Self-pay | Admitting: Emergency Medicine

## 2020-03-01 ENCOUNTER — Emergency Department (HOSPITAL_COMMUNITY)
Admission: EM | Admit: 2020-03-01 | Discharge: 2020-03-01 | Disposition: A | Discharge status: Home / Self Care | Payer: Medicaid Other | Attending: Emergency Medicine | Admitting: Emergency Medicine

## 2020-03-01 ENCOUNTER — Other Ambulatory Visit: Payer: Self-pay

## 2020-03-01 ENCOUNTER — Emergency Department (HOSPITAL_COMMUNITY): Payer: Medicaid Other

## 2020-03-01 DIAGNOSIS — S02601A Fracture of unspecified part of body of right mandible, initial encounter for closed fracture: Secondary | ICD-10-CM | POA: Diagnosis not present

## 2020-03-01 DIAGNOSIS — W19XXXA Unspecified fall, initial encounter: Secondary | ICD-10-CM

## 2020-03-01 DIAGNOSIS — Z87891 Personal history of nicotine dependence: Secondary | ICD-10-CM | POA: Diagnosis not present

## 2020-03-01 DIAGNOSIS — Y929 Unspecified place or not applicable: Secondary | ICD-10-CM | POA: Diagnosis not present

## 2020-03-01 DIAGNOSIS — Z79899 Other long term (current) drug therapy: Secondary | ICD-10-CM | POA: Diagnosis not present

## 2020-03-01 DIAGNOSIS — W010XXA Fall on same level from slipping, tripping and stumbling without subsequent striking against object, initial encounter: Secondary | ICD-10-CM | POA: Insufficient documentation

## 2020-03-01 DIAGNOSIS — Z23 Encounter for immunization: Secondary | ICD-10-CM | POA: Insufficient documentation

## 2020-03-01 DIAGNOSIS — S0502XA Injury of conjunctiva and corneal abrasion without foreign body, left eye, initial encounter: Secondary | ICD-10-CM | POA: Insufficient documentation

## 2020-03-01 DIAGNOSIS — S0592XA Unspecified injury of left eye and orbit, initial encounter: Secondary | ICD-10-CM | POA: Diagnosis present

## 2020-03-01 DIAGNOSIS — Y999 Unspecified external cause status: Secondary | ICD-10-CM | POA: Diagnosis not present

## 2020-03-01 DIAGNOSIS — S022XXA Fracture of nasal bones, initial encounter for closed fracture: Secondary | ICD-10-CM | POA: Diagnosis not present

## 2020-03-01 DIAGNOSIS — Y939 Activity, unspecified: Secondary | ICD-10-CM | POA: Diagnosis not present

## 2020-03-01 MED ORDER — FLUORESCEIN SODIUM 1 MG OP STRP
1.0000 | ORAL_STRIP | Freq: Once | OPHTHALMIC | Status: AC
  Administered 2020-03-01: 1 via OPHTHALMIC
  Filled 2020-03-01: qty 1

## 2020-03-01 MED ORDER — TETANUS-DIPHTH-ACELL PERTUSSIS 5-2.5-18.5 LF-MCG/0.5 IM SUSP
0.5000 mL | Freq: Once | INTRAMUSCULAR | Status: AC
  Administered 2020-03-01: 0.5 mL via INTRAMUSCULAR
  Filled 2020-03-01: qty 0.5

## 2020-03-01 MED ORDER — HYDROCODONE-ACETAMINOPHEN 5-325 MG PO TABS
1.0000 | ORAL_TABLET | Freq: Once | ORAL | Status: AC
  Administered 2020-03-01: 1 via ORAL
  Filled 2020-03-01: qty 1

## 2020-03-01 MED ORDER — TETRACAINE HCL 0.5 % OP SOLN
2.0000 [drp] | Freq: Once | OPHTHALMIC | Status: AC
  Administered 2020-03-01: 2 [drp] via OPHTHALMIC
  Filled 2020-03-01: qty 4

## 2020-03-01 MED ORDER — GENTAMICIN SULFATE 0.3 % OP SOLN: 1.0000 [drp] | OPHTHALMIC | 0 refills | Status: DC

## 2020-03-01 MED ORDER — HYDROCODONE-ACETAMINOPHEN 5-325 MG PO TABS: 1.0000 | ORAL_TABLET | ORAL | 0 refills | Status: DC | PRN

## 2020-03-01 NOTE — ED Triage Notes (Addendum)
Pt states she tripped and fell last night.  C/o bilateral jaw pain, gums bleeding, and swelling to L eye. States she can see out of L eye but has light sensitivity.
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
## 2020-03-01 NOTE — Discharge Instructions (Signed)
Liquid diet, no chewing, you may brush your teeth. Take Norco as needed as prescribed for pain. Call to follow up with Dr. Ulice Bold tomorrow morning.  Do not blow your nose. Apply drops to left eye every 4 hours. Follow up with ophthalmology.

## 2020-03-02 ENCOUNTER — Encounter (HOSPITAL_BASED_OUTPATIENT_CLINIC_OR_DEPARTMENT_OTHER): Payer: Self-pay | Admitting: Plastic Surgery

## 2020-03-02 ENCOUNTER — Other Ambulatory Visit (HOSPITAL_COMMUNITY)
Admission: RE | Admit: 2020-03-02 | Discharge: 2020-03-02 | Disposition: A | Discharge status: Home / Self Care | Payer: Medicaid Other | Source: Ambulatory Visit | Attending: Plastic Surgery | Admitting: Plastic Surgery

## 2020-03-02 ENCOUNTER — Ambulatory Visit (INDEPENDENT_AMBULATORY_CARE_PROVIDER_SITE_OTHER): Payer: Medicaid Other | Admitting: Plastic Surgery

## 2020-03-02 ENCOUNTER — Encounter: Payer: Self-pay | Admitting: Plastic Surgery

## 2020-03-02 ENCOUNTER — Other Ambulatory Visit: Payer: Self-pay

## 2020-03-02 DIAGNOSIS — S02601A Fracture of unspecified part of body of right mandible, initial encounter for closed fracture: Secondary | ICD-10-CM

## 2020-03-02 DIAGNOSIS — Z20822 Contact with and (suspected) exposure to covid-19: Secondary | ICD-10-CM | POA: Insufficient documentation

## 2020-03-02 DIAGNOSIS — Z01812 Encounter for preprocedural laboratory examination: Secondary | ICD-10-CM | POA: Diagnosis present

## 2020-03-02 DIAGNOSIS — S02609A Fracture of mandible, unspecified, initial encounter for closed fracture: Secondary | ICD-10-CM | POA: Insufficient documentation

## 2020-03-02 DIAGNOSIS — S022XXA Fracture of nasal bones, initial encounter for closed fracture: Secondary | ICD-10-CM | POA: Insufficient documentation

## 2020-03-02 LAB — SARS CORONAVIRUS 2 (TAT 6-24 HRS): SARS Coronavirus 2: NEGATIVE

## 2020-03-02 MED ORDER — HYDROCODONE-ACETAMINOPHEN 7.5-325 MG/15ML PO SOLN: 15.0000 mL | Freq: Four times a day (QID) | ORAL | 0 refills | Status: DC | PRN

## 2020-03-02 MED ORDER — HYDROCODONE-ACETAMINOPHEN 5-217 MG/10ML PO SOLN: 10.0000 mL | Freq: Two times a day (BID) | ORAL | 0 refills | Status: DC | PRN

## 2020-03-02 MED ORDER — AMOXICILLIN-POT CLAVULANATE 250-62.5 MG/5ML PO SUSR
500.0000 mg | Freq: Three times a day (TID) | ORAL | 0 refills | Status: AC
Start: 2020-03-02 — End: 2020-03-05

## 2020-03-02 NOTE — Addendum Note (Signed)
Addended by: Peggye Form on: 03/02/2020 03:43 PM   Modules accepted: Orders

## 2020-03-02 NOTE — H&P (View-Only) (Signed)
Patient ID: IRIDIAN READER, female    DOB: 18-Oct-1995, 24 y.o.   MRN: [id number]   Chief Complaint  Patient presents with   Consult    The patient is a 24 year old female here for evaluation of her face.  She.  She sustained a fracture of follow-up.  She was seen in the emergency over the weekend.  The CT scan showed a mildly displaced fracture of her left nasal bone and a nondisplaced oblique fracture of the right anterior mandibular body.  She has ecchymosis and bruising of her left periorbital area.  She has swelling of her right mandible.  She does not have malocclusion.  She is otherwise healthy.  She was seen today with-month-old child.  She is a smoker.   Review of Systems  Constitutional: Positive for activity change and appetite change. Negative for fever.  Eyes: Positive for pain.  Respiratory: Negative for chest tightness and shortness of breath.   Cardiovascular: Negative for leg swelling.  Gastrointestinal: Negative for abdominal distention and abdominal pain.  Endocrine: Negative.   Genitourinary: Negative.   Musculoskeletal: Negative.   Hematological: Negative.   Psychiatric/Behavioral: Negative.     Past Medical History:  Diagnosis Date   Anemia    Infection    UTI   MRSA (methicillin resistant Staphylococcus aureus) infection 2015    Past Surgical History:  Procedure Laterality Date   NO PAST SURGERIES        Current Outpatient Medications:    gentamicin (GARAMYCIN) 0.3 % ophthalmic solution, Place 1 drop into the left eye every 4 (four) hours., Disp: 5 mL, Rfl: 0   HYDROcodone-acetaminophen (NORCO/VICODIN) 5-325 MG tablet, Take 1 tablet by mouth every 4 (four) hours as needed., Disp: 10 tablet, Rfl: 0   naproxen sodium (ALEVE) 220 MG tablet, Take 440 mg by mouth as needed (pain)., Disp: , Rfl:    Objective:   Vitals:   03/02/20 1419  BP: 120/77  Pulse: 90  Temp: 97.8 F (36.6 C)  SpO2: 98%    Physical Exam Vitals and nursing note  reviewed.  HENT:     Head: Normocephalic.  Cardiovascular:     Rate and Rhythm: Normal rate.     Pulses: Normal pulses.  Pulmonary:     Effort: Pulmonary effort is normal. No respiratory distress.  Abdominal:     General: Abdomen is flat. There is no distension.     Tenderness: There is no abdominal tenderness.  Skin:    General: Skin is warm.     Capillary Refill: Capillary refill takes less than 2 seconds.  Neurological:     General: No focal deficit present.     Mental Status: She is alert and oriented to person, place, and time.  Psychiatric:        Mood and Affect: Mood normal.        Behavior: Behavior normal.     Assessment & Plan:  Closed fracture of nasal bone, initial encounter  Closed fracture of right side of mandibular body, initial encounter (HCC)  For open reduction internal station of mandible fracture with fixation and closed nasal fracture reduction.   Patient is in she is aware of that she should not smoke during her post operative course. Her risk of DVT is minimal. The consent was obtained with risks and complications reviewed which included bleeding, pain, scar, infection and the risk of anesthesia.  The patients questions were answered to the patients expressed satisfaction.   [name] Anarely Nicholls, DO

## 2020-03-02 NOTE — Progress Notes (Signed)
° °  Patient ID: Shannon Lewis, female    DOB: 05/30/1996, 24 y.o.   MRN: [id number] ° ° °Chief Complaint  °Patient presents with  °• Consult  ° ° °The patient is a 24-year-old female here for evaluation of her face.  She.  She sustained a fracture of follow-up.  She was seen in the emergency over the weekend.  The CT scan showed a mildly displaced fracture of her left nasal bone and a nondisplaced oblique fracture of the right anterior mandibular body.  She has ecchymosis and bruising of her left periorbital area.  She has swelling of her right mandible.  She does not have malocclusion.  She is otherwise healthy.  She was seen today with-month-old child.  She is a smoker. ° ° °Review of Systems  °Constitutional: Positive for activity change and appetite change. Negative for fever.  °Eyes: Positive for pain.  °Respiratory: Negative for chest tightness and shortness of breath.   °Cardiovascular: Negative for leg swelling.  °Gastrointestinal: Negative for abdominal distention and abdominal pain.  °Endocrine: Negative.   °Genitourinary: Negative.   °Musculoskeletal: Negative.   °Hematological: Negative.   °Psychiatric/Behavioral: Negative.   ° ° °Past Medical History:  °Diagnosis Date  °• Anemia   °• Infection   ° UTI  °• MRSA (methicillin resistant Staphylococcus aureus) infection 2015  °  °Past Surgical History:  °Procedure Laterality Date  °• NO PAST SURGERIES    °  ° ° °Current Outpatient Medications:  °•  gentamicin (GARAMYCIN) 0.3 % ophthalmic solution, Place 1 drop into the left eye every 4 (four) hours., Disp: 5 mL, Rfl: 0 °•  HYDROcodone-acetaminophen (NORCO/VICODIN) 5-325 MG tablet, Take 1 tablet by mouth every 4 (four) hours as needed., Disp: 10 tablet, Rfl: 0 °•  naproxen sodium (ALEVE) 220 MG tablet, Take 440 mg by mouth as needed (pain)., Disp: , Rfl:   ° °Objective:  ° °Vitals:  ° 03/02/20 1419  °BP: 120/77  °Pulse: 90  °Temp: 97.8 °F (36.6 °C)  °SpO2: 98%  ° ° °Physical Exam °Vitals and nursing note  reviewed.  °HENT:  °   Head: Normocephalic.  °Cardiovascular:  °   Rate and Rhythm: Normal rate.  °   Pulses: Normal pulses.  °Pulmonary:  °   Effort: Pulmonary effort is normal. No respiratory distress.  °Abdominal:  °   General: Abdomen is flat. There is no distension.  °   Tenderness: There is no abdominal tenderness.  °Skin: °   General: Skin is warm.  °   Capillary Refill: Capillary refill takes less than 2 seconds.  °Neurological:  °   General: No focal deficit present.  °   Mental Status: She is alert and oriented to person, place, and time.  °Psychiatric:     °   Mood and Affect: Mood normal.     °   Behavior: Behavior normal.  ° ° ° °Assessment & Plan:  °Closed fracture of nasal bone, initial encounter ° °Closed fracture of right side of mandibular body, initial encounter (HCC) ° °For open reduction internal station of mandible fracture with fixation and closed nasal fracture reduction.   °Patient is in she is aware of that she should not smoke during her post operative course. °Her risk of DVT is minimal. °The consent was obtained with risks and complications reviewed which included bleeding, pain, scar, infection and the risk of anesthesia.  The patients questions were answered to the patients expressed satisfaction. ° ° °Selig Wampole S Starlit Raburn, DO °

## 2020-03-04 ENCOUNTER — Telehealth: Payer: Self-pay

## 2020-03-04 MED ORDER — HYDROCODONE-ACETAMINOPHEN 7.5-325 MG/15ML PO SOLN: 15.0000 mL | Freq: Four times a day (QID) | ORAL | 0 refills | Status: AC | PRN

## 2020-03-04 NOTE — Telephone Encounter (Signed)
Called patient to discuss, she is doing fine other than the pain, had her last dose of hycet this AM. Discussed this prescription should be for post-operative pain and we will be unable to prescribe any additional pain medication. Recommend taking medication only when needed for severe pain, otherwise take ibuprofen and/or tylenol. Patient agreed/understood.

## 2020-03-04 NOTE — Addendum Note (Signed)
Addended by: Peggye Form on: 03/04/2020 01:15 PM   Modules accepted: Orders
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
## 2020-03-05 ENCOUNTER — Encounter (HOSPITAL_BASED_OUTPATIENT_CLINIC_OR_DEPARTMENT_OTHER): Payer: Self-pay | Admitting: Plastic Surgery

## 2020-03-05 ENCOUNTER — Other Ambulatory Visit: Payer: Self-pay

## 2020-03-05 ENCOUNTER — Ambulatory Visit (HOSPITAL_BASED_OUTPATIENT_CLINIC_OR_DEPARTMENT_OTHER): Payer: Medicaid Other | Admitting: Anesthesiology

## 2020-03-05 ENCOUNTER — Ambulatory Visit (HOSPITAL_BASED_OUTPATIENT_CLINIC_OR_DEPARTMENT_OTHER)
Admission: RE | Admit: 2020-03-05 | Discharge: 2020-03-05 | Disposition: A | Discharge status: Home / Self Care | Payer: Medicaid Other | Attending: Plastic Surgery | Admitting: Plastic Surgery

## 2020-03-05 ENCOUNTER — Encounter (HOSPITAL_BASED_OUTPATIENT_CLINIC_OR_DEPARTMENT_OTHER)
Admission: RE | Disposition: A | Discharge status: Home / Self Care | Payer: Self-pay | Source: Home / Self Care | Attending: Plastic Surgery

## 2020-03-05 DIAGNOSIS — Z87891 Personal history of nicotine dependence: Secondary | ICD-10-CM | POA: Insufficient documentation

## 2020-03-05 DIAGNOSIS — Y939 Activity, unspecified: Secondary | ICD-10-CM | POA: Insufficient documentation

## 2020-03-05 DIAGNOSIS — S022XXA Fracture of nasal bones, initial encounter for closed fracture: Secondary | ICD-10-CM | POA: Insufficient documentation

## 2020-03-05 DIAGNOSIS — X58XXXA Exposure to other specified factors, initial encounter: Secondary | ICD-10-CM | POA: Insufficient documentation

## 2020-03-05 DIAGNOSIS — S02601A Fracture of unspecified part of body of right mandible, initial encounter for closed fracture: Secondary | ICD-10-CM | POA: Insufficient documentation

## 2020-03-05 DIAGNOSIS — Z8614 Personal history of Methicillin resistant Staphylococcus aureus infection: Secondary | ICD-10-CM | POA: Insufficient documentation

## 2020-03-05 HISTORY — PX: ORIF MANDIBULAR FRACTURE: SHX2127

## 2020-03-05 HISTORY — PX: CLOSED REDUCTION NASAL FRACTURE: SHX5365

## 2020-03-05 LAB — POCT PREGNANCY, URINE: Preg Test, Ur: NEGATIVE

## 2020-03-05 SURGERY — OPEN REDUCTION INTERNAL FIXATION (ORIF) MANDIBULAR FRACTURE
Anesthesia: General | Site: Nose

## 2020-03-05 MED ORDER — LIDOCAINE HCL (CARDIAC) PF 100 MG/5ML IV SOSY
PREFILLED_SYRINGE | INTRAVENOUS | Status: DC | PRN
  Administered 2020-03-05: 50 mg via INTRAVENOUS
  Administered 2020-03-05: 30 mg via INTRAVENOUS

## 2020-03-05 MED ORDER — FENTANYL CITRATE (PF) 100 MCG/2ML IJ SOLN
INTRAMUSCULAR | Status: AC
  Filled 2020-03-05: qty 2

## 2020-03-05 MED ORDER — BACITRACIN ZINC 500 UNIT/GM EX OINT
TOPICAL_OINTMENT | CUTANEOUS | Status: AC
  Filled 2020-03-05: qty 28.35

## 2020-03-05 MED ORDER — PROPOFOL 10 MG/ML IV BOLUS
INTRAVENOUS | Status: AC
  Filled 2020-03-05: qty 20

## 2020-03-05 MED ORDER — HYDROMORPHONE HCL 1 MG/ML IJ SOLN
INTRAMUSCULAR | Status: AC
  Filled 2020-03-05: qty 0.5

## 2020-03-05 MED ORDER — SUGAMMADEX SODIUM 200 MG/2ML IV SOLN
INTRAVENOUS | Status: DC | PRN
  Administered 2020-03-05: 200 mg via INTRAVENOUS

## 2020-03-05 MED ORDER — ONDANSETRON HCL 4 MG/2ML IJ SOLN: 4.0000 mg | Freq: Once | INTRAMUSCULAR | Status: DC | PRN

## 2020-03-05 MED ORDER — ACETAMINOPHEN 325 MG PO TABS: 650.0000 mg | ORAL_TABLET | ORAL | Status: DC | PRN

## 2020-03-05 MED ORDER — OXYMETAZOLINE HCL 0.05 % NA SOLN
NASAL | Status: DC | PRN
Start: 2020-03-05 — End: 2020-03-05
  Administered 2020-03-05: 1 via NASAL

## 2020-03-05 MED ORDER — SODIUM CHLORIDE 0.9% FLUSH: 3.0000 mL | INTRAVENOUS | Status: DC | PRN

## 2020-03-05 MED ORDER — CHLORHEXIDINE GLUCONATE CLOTH 2 % EX PADS: 6.0000 | MEDICATED_PAD | Freq: Once | CUTANEOUS | Status: DC

## 2020-03-05 MED ORDER — LACTATED RINGERS IV SOLN: INTRAVENOUS | Status: DC

## 2020-03-05 MED ORDER — BACITRACIN-NEOMYCIN-POLYMYXIN OINTMENT TUBE
TOPICAL_OINTMENT | CUTANEOUS | Status: AC
  Filled 2020-03-05: qty 14.17

## 2020-03-05 MED ORDER — OXYMETAZOLINE HCL 0.05 % NA SOLN
NASAL | Status: AC
  Filled 2020-03-05: qty 30

## 2020-03-05 MED ORDER — CEFAZOLIN SODIUM-DEXTROSE 2-4 GM/100ML-% IV SOLN
2.0000 g | INTRAVENOUS | Status: AC
  Administered 2020-03-05: 2 g via INTRAVENOUS

## 2020-03-05 MED ORDER — ROCURONIUM BROMIDE 10 MG/ML (PF) SYRINGE
PREFILLED_SYRINGE | INTRAVENOUS | Status: AC
  Filled 2020-03-05: qty 10

## 2020-03-05 MED ORDER — CEFAZOLIN SODIUM-DEXTROSE 2-4 GM/100ML-% IV SOLN
INTRAVENOUS | Status: AC
  Filled 2020-03-05: qty 100

## 2020-03-05 MED ORDER — ONDANSETRON HCL 4 MG/2ML IJ SOLN
INTRAMUSCULAR | Status: DC | PRN
  Administered 2020-03-05: 4 mg via INTRAVENOUS

## 2020-03-05 MED ORDER — GLYCOPYRROLATE 0.2 MG/ML IJ SOLN
INTRAMUSCULAR | Status: DC | PRN
  Administered 2020-03-05 (×2): .1 mg via INTRAVENOUS

## 2020-03-05 MED ORDER — ROCURONIUM BROMIDE 10 MG/ML (PF) SYRINGE
PREFILLED_SYRINGE | INTRAVENOUS | Status: DC | PRN
  Administered 2020-03-05: 50 mg via INTRAVENOUS

## 2020-03-05 MED ORDER — OXYMETAZOLINE HCL 0.05 % NA SOLN
NASAL | Status: DC | PRN
  Administered 2020-03-05: 1 via TOPICAL

## 2020-03-05 MED ORDER — LIDOCAINE 2% (20 MG/ML) 5 ML SYRINGE
INTRAMUSCULAR | Status: AC
  Filled 2020-03-05: qty 5

## 2020-03-05 MED ORDER — MIDAZOLAM HCL 2 MG/2ML IJ SOLN
INTRAMUSCULAR | Status: AC
  Filled 2020-03-05: qty 2

## 2020-03-05 MED ORDER — LIDOCAINE-EPINEPHRINE 1 %-1:100000 IJ SOLN
INTRAMUSCULAR | Status: AC
  Filled 2020-03-05: qty 1

## 2020-03-05 MED ORDER — BUPIVACAINE HCL (PF) 0.25 % IJ SOLN
INTRAMUSCULAR | Status: AC
  Filled 2020-03-05: qty 30

## 2020-03-05 MED ORDER — ACETAMINOPHEN 80 MG RE SUPP: 650.0000 mg | RECTAL | Status: DC | PRN

## 2020-03-05 MED ORDER — SODIUM CHLORIDE 0.9% FLUSH: 3.0000 mL | Freq: Two times a day (BID) | INTRAVENOUS | Status: DC

## 2020-03-05 MED ORDER — SODIUM CHLORIDE 0.9 % IV SOLN: 250.0000 mL | INTRAVENOUS | Status: DC | PRN

## 2020-03-05 MED ORDER — PROPOFOL 10 MG/ML IV BOLUS
INTRAVENOUS | Status: DC | PRN
  Administered 2020-03-05: 140 mg via INTRAVENOUS

## 2020-03-05 MED ORDER — DEXAMETHASONE SODIUM PHOSPHATE 10 MG/ML IJ SOLN
INTRAMUSCULAR | Status: DC | PRN
  Administered 2020-03-05: 5 mg via INTRAVENOUS

## 2020-03-05 MED ORDER — HYDROMORPHONE HCL 1 MG/ML IJ SOLN
0.2500 mg | INTRAMUSCULAR | Status: DC | PRN
  Administered 2020-03-05 (×2): 0.25 mg via INTRAVENOUS

## 2020-03-05 MED ORDER — FENTANYL CITRATE (PF) 100 MCG/2ML IJ SOLN
INTRAMUSCULAR | Status: DC | PRN
  Administered 2020-03-05: 50 ug via INTRAVENOUS
  Administered 2020-03-05: 100 ug via INTRAVENOUS
  Administered 2020-03-05: 50 ug via INTRAVENOUS

## 2020-03-05 MED ORDER — MORPHINE SULFATE (PF) 4 MG/ML IV SOLN: 2.0000 mg | INTRAVENOUS | Status: DC | PRN

## 2020-03-05 MED ORDER — GLYCOPYRROLATE PF 0.2 MG/ML IJ SOSY
PREFILLED_SYRINGE | INTRAMUSCULAR | Status: AC
  Filled 2020-03-05: qty 1

## 2020-03-05 MED ORDER — LIDOCAINE-EPINEPHRINE 1 %-1:100000 IJ SOLN
INTRAMUSCULAR | Status: DC | PRN
  Administered 2020-03-05: 10 mL

## 2020-03-05 MED ORDER — MEPERIDINE HCL 25 MG/ML IJ SOLN: 6.2500 mg | INTRAMUSCULAR | Status: DC | PRN

## 2020-03-05 MED ORDER — MIDAZOLAM HCL 2 MG/2ML IJ SOLN
INTRAMUSCULAR | Status: DC | PRN
  Administered 2020-03-05: 2 mg via INTRAVENOUS

## 2020-03-05 SURGICAL SUPPLY — 86 items
ADH SKN CLS APL DERMABOND .7 (GAUZE/BANDAGES/DRESSINGS)
APL SKNCLS STERI-STRIP NONHPOA (GAUZE/BANDAGES/DRESSINGS) ×2
APL SWBSTK 6 STRL LF DISP (MISCELLANEOUS) ×2
APPLICATOR COTTON TIP 6 STRL (MISCELLANEOUS) ×2 IMPLANT
APPLICATOR COTTON TIP 6IN STRL (MISCELLANEOUS) ×4
BENZOIN TINCTURE PRP APPL 2/3 (GAUZE/BANDAGES/DRESSINGS) ×4 IMPLANT
BIT DRILL 1.6X115 (BIT) ×2
BIT DRILL 1.6X115MM (BIT) IMPLANT
BIT DRILL 1.6X50 (BIT) ×2 IMPLANT
BLADE SURG 15 STRL LF DISP TIS (BLADE) IMPLANT
BLADE SURG 15 STRL SS (BLADE)
CANISTER SUCT 1200ML W/VALVE (MISCELLANEOUS) ×4 IMPLANT
CLEANER CAUTERY TIP 5X5 PAD (MISCELLANEOUS) IMPLANT
CLOSURE WOUND 1/2 X4 (GAUZE/BANDAGES/DRESSINGS) ×1
COVER BACK TABLE 60X90IN (DRAPES) ×4 IMPLANT
COVER MAYO STAND STRL (DRAPES) ×4 IMPLANT
COVER WAND RF STERILE (DRAPES) IMPLANT
DECANTER SPIKE VIAL GLASS SM (MISCELLANEOUS) ×4 IMPLANT
DEPRESSOR TONGUE BLADE STERILE (MISCELLANEOUS) IMPLANT
DERMABOND ADVANCED (GAUZE/BANDAGES/DRESSINGS)
DERMABOND ADVANCED .7 DNX12 (GAUZE/BANDAGES/DRESSINGS) IMPLANT
DRILL BIT 1.6X115MM (BIT) ×4
DRSG NASOPORE 8CM (GAUZE/BANDAGES/DRESSINGS) IMPLANT
ELECT COATED BLADE 2.86 ST (ELECTRODE) ×4 IMPLANT
ELECT NDL BLADE 2-5/6 (NEEDLE) ×2 IMPLANT
ELECT NEEDLE BLADE 2-5/6 (NEEDLE) ×4 IMPLANT
ELECT REM PT RETURN 9FT ADLT (ELECTROSURGICAL) ×4
ELECTRODE REM PT RTRN 9FT ADLT (ELECTROSURGICAL) ×2 IMPLANT
GAUZE PACKING FOLDED 2  STR (GAUZE/BANDAGES/DRESSINGS)
GAUZE PACKING FOLDED 2 STR (GAUZE/BANDAGES/DRESSINGS) IMPLANT
GAUZE VASELINE FOILPK 1/2 X 72 (GAUZE/BANDAGES/DRESSINGS) IMPLANT
GLOVE BIO SURGEON STRL SZ 6.5 (GLOVE) ×3 IMPLANT
GLOVE BIO SURGEONS STRL SZ 6.5 (GLOVE) ×1
GOWN STRL REUS W/ TWL LRG LVL3 (GOWN DISPOSABLE) ×4 IMPLANT
GOWN STRL REUS W/TWL LRG LVL3 (GOWN DISPOSABLE) ×8
KIT SPLINT NASAL DENVER LRG BE (GAUZE/BANDAGES/DRESSINGS) IMPLANT
KIT SPLINT NASAL DENVER MIN BE (GAUZE/BANDAGES/DRESSINGS) ×2 IMPLANT
KIT SPLINT NASAL DENVER PET BE (GAUZE/BANDAGES/DRESSINGS) IMPLANT
KIT SPLINT NASAL DENVER SM BEI (GAUZE/BANDAGES/DRESSINGS) IMPLANT
NDL BLUNT 17GA (NEEDLE) IMPLANT
NDL PRECISIONGLIDE 27X1.5 (NEEDLE) ×2 IMPLANT
NEEDLE BLUNT 17GA (NEEDLE) IMPLANT
NEEDLE PRECISIONGLIDE 27X1.5 (NEEDLE) ×4 IMPLANT
NS IRRIG 1000ML POUR BTL (IV SOLUTION) ×4 IMPLANT
PACK ENT DAY SURGERY (CUSTOM PROCEDURE TRAY) ×4 IMPLANT
PAD CLEANER CAUTERY TIP 5X5 (MISCELLANEOUS)
PATTIES SURGICAL .5 X3 (DISPOSABLE) ×4 IMPLANT
PENCIL SMOKE EVACUATOR (MISCELLANEOUS) ×4 IMPLANT
PLATE LOCK 4H STRT 1.6 GOLD (Plate) ×2 IMPLANT
SCISSORS WIRE ANG 4 3/4 DISP (INSTRUMENTS) IMPLANT
SCREW NON LOCK X-DR 2.0X12 (Screw) ×2 IMPLANT
SCREW NON LOCK X-DR 2.0X14 (Screw) ×6 IMPLANT
SCREW NON LOCK X-DR 2.3X14 (Screw) ×2 IMPLANT
SCREW SD IMF HEX 2.0X9 (Screw) ×8 IMPLANT
SET BASIN DAY SURGERY F.S. (CUSTOM PROCEDURE TRAY) ×4 IMPLANT
SET WALTER ACTIVATION W/DRAPE (SET/KITS/TRAYS/PACK) ×2 IMPLANT
SHEET MEDIUM DRAPE 40X70 STRL (DRAPES) ×4 IMPLANT
SHEILD EYE MED CORNL SHD 22X21 (OPHTHALMIC RELATED)
SHIELD EYE MED CORNL SHD 22X21 (OPHTHALMIC RELATED) IMPLANT
SLEEVE SCD COMPRESS KNEE MED (MISCELLANEOUS) IMPLANT
SPLINT NASAL AIRWAY SILICONE (MISCELLANEOUS) IMPLANT
SPLINT NASAL THERMO PLAST (MISCELLANEOUS) IMPLANT
SPONGE GAUZE 2X2 8PLY STER LF (GAUZE/BANDAGES/DRESSINGS)
SPONGE GAUZE 2X2 8PLY STRL LF (GAUZE/BANDAGES/DRESSINGS) IMPLANT
STRIP CLOSURE SKIN 1/2X4 (GAUZE/BANDAGES/DRESSINGS) ×3 IMPLANT
SUT CHROMIC 6 0 PS 4 (SUTURE) IMPLANT
SUT ETHILON 3 0 PS 1 (SUTURE) IMPLANT
SUT SILK 2 0 PERMA HAND 18 BK (SUTURE) IMPLANT
SUT SILK 3 0 PS 1 (SUTURE) ×2 IMPLANT
SUT SILK 3 0 SH 30 (SUTURE) IMPLANT
SUT STEEL 0 (SUTURE)
SUT STEEL 0 18XMFL TIE 17 (SUTURE) IMPLANT
SUT STEEL 1 (SUTURE) IMPLANT
SUT STEEL 2 (SUTURE) IMPLANT
SUT VIC AB 3-0 FS2 27 (SUTURE) ×2 IMPLANT
SUT VICRYL 4-0 PS2 18IN ABS (SUTURE) ×6 IMPLANT
SYR BULB EAR ULCER 3OZ GRN STR (SYRINGE) IMPLANT
SYR CONTROL 10ML LL (SYRINGE) IMPLANT
TOOTHBRUSH ADULT (PERSONAL CARE ITEMS) ×2 IMPLANT
TOWEL GREEN STERILE FF (TOWEL DISPOSABLE) ×4 IMPLANT
TRAY DSU PREP LF (CUSTOM PROCEDURE TRAY) ×4 IMPLANT
TUBE CONNECTING 20'X1/4 (TUBING) ×1
TUBE CONNECTING 20X1/4 (TUBING) ×3 IMPLANT
TUBE SALEM SUMP 16 FR W/ARV (TUBING) ×4 IMPLANT
WIRE 24 GAUGE OMINIMAX MMF (WIRE) ×2 IMPLANT
YANKAUER SUCT BULB TIP NO VENT (SUCTIONS) ×4 IMPLANT

## 2020-03-05 NOTE — Interval H&P Note (Signed)
History and Physical Interval Note:  03/05/2020 10:25 AM  Shannon Lewis  has presented today for surgery, with the diagnosis of MANDIBLE AND NASAL FRACTURE.  The various methods of treatment have been discussed with the patient and family. After consideration of risks, benefits and other options for treatment, the patient has consented to  Procedure(s): OPEN REDUCTION INTERNAL FIXATION (ORIF) MANDIBULAR FRACTURE (N/A) CLOSED REDUCTION NASAL FRACTURE (N/A) as a surgical intervention.  The patient's history has been reviewed, patient examined, no change in status, stable for surgery.  I have reviewed the patient's chart and labs.  Questions were answered to the patient's satisfaction.     [name] [name]

## 2020-03-05 NOTE — Progress Notes (Signed)
Patient and mother instructed how to cut wires if needed in emergency. Wire cutters sent home with mother. Instructed that someone needs to be with patient 24/7 until wires are removed. Sleep with HOB elevated. Patient ready to go home. Denies nausea.

## 2020-03-05 NOTE — Anesthesia Procedure Notes (Signed)
Procedure Name: Intubation Date/Time: 03/05/2020 10:54 AM Performed by: Raenette Rover, CRNA Pre-anesthesia Checklist: Patient identified, Emergency Drugs available, Suction available and Patient being monitored Patient Re-evaluated:Patient Re-evaluated prior to induction Oxygen Delivery Method: Circle system utilized Preoxygenation: Pre-oxygenation with 100% oxygen Induction Type: IV induction Ventilation: Mask ventilation without difficulty Laryngoscope Size: Mac and 3 Grade View: Grade I Nasal Tubes: Right, Nasal prep performed and Magill forceps- large, utilized Tube size: 7.0 mm Number of attempts: 1 Placement Confirmation: ETT inserted through vocal cords under direct vision,  positive ETCO2 and breath sounds checked- equal and bilateral Secured at: 26 cm Tube secured with: Tape Dental Injury: Teeth and Oropharynx as per pre-operative assessment

## 2020-03-05 NOTE — Op Note (Signed)
Operative Note   DATE OF OPERATION: 03/05/2020  LOCATION:  [facility]   SURGICAL DIVISION: Plastic Surgery  PREOPERATIVE DIAGNOSES:  Nasal fracture and mandible fracture  POSTOPERATIVE DIAGNOSES:  same  PROCEDURE:   1. Closed Nasal fracture reduction.   2. Open reduction internal fixation of mandible fracture with maxillomandibular fixation  SURGEON: Alan Ripper Sanger Jovann Luse, DO  ASSISTANT: Keenan Bachelor, PA  ANESTHESIA:  General.   COMPLICATIONS: None.   INDICATIONS FOR PROCEDURE:  The patient, Shannon Lewis is a 24 y.o. female born on 1996-07-02, is here for treatment of a nasal fracture and mandible fracture. MRN: [id number]  CONSENT:  Informed consent was obtained directly from the patient. Risks, benefits and alternatives were fully discussed. Specific risks including but not limited to bleeding, infection, hematoma, seroma, scarring, pain, infection, contracture, asymmetry, wound healing problems, and need for further surgery were all discussed. The patient did have an ample opportunity to have questions answered to satisfaction.   DESCRIPTION OF PROCEDURE:   The patient was taken to the operating room. SCDs were placed and IV antibiotics were given. The patient's operative site was prepped and draped in a sterile fashion. A time out was performed and all information was confirmed to be correct.  General anesthesia was administered.   Mandible:  Local with epinephrine was injected into the right lower buccal mucosa for intraoperative hemostasis and post operative pain control. The patient was placed in maxillomandibular fixation with the MMF screws for a four point fixation. There was good occlusion.  The Bovie was used to incise the mucosa.  The bone elevator was used to dissect the soft tissue off the lower right mandible fracture.  The 1.6 mm plate was selected and bent to have contact with the bone.  The drill with guide was used to place the  holes.  The dept was measured and the 4 screws selected.  There remained good occlusion.  The muscle was re-suspended with the 4-0 Monocryl.  The mucosa was closed with the 4-0 Vicryl.    Nose:  The afrin soaked pledgets were placed in the nose.    After waiting several minutes for the vasoconstriction the left nasal fracture was realigned.  Steri strips were applied to the nose.  The nasal splint was placed.  The posterior pharynx was suctioned to remove the blood.  The patient tolerated the procedure well.  There were no complications. The patient was allowed to wake from anesthesia, extubated and taken to the recovery room in satisfactory condition.   The advanced practice practitioner (APP) assisted throughout the case.  The APP was essential in retraction and counter traction when needed to make the case progress smoothly.  This retraction and assistance made it possible to see the tissue plans for the procedure.  The assistance was needed for blood control, tissue re-approximation and assisted with closure of the incision site.

## 2020-03-05 NOTE — Transfer of Care (Signed)
Immediate Anesthesia Transfer of Care Note  Patient: Shannon Lewis  Procedure(s) Performed: OPEN REDUCTION INTERNAL FIXATION (ORIF) MANDIBULAR FRACTURE (N/A Mouth) CLOSED REDUCTION NASAL FRACTURE (N/A Nose)  Patient Location: PACU  Anesthesia Type:General  Level of Consciousness: awake, alert , oriented, drowsy and patient cooperative  Airway & Oxygen Therapy: Patient Spontanous Breathing and Patient connected to face mask oxygen  Post-op Assessment: Report given to RN and Post -op Vital signs reviewed and stable  Post vital signs: Reviewed and stable  Last Vitals: see PACU VS record Vitals Value Taken Time  BP    Temp    Pulse    Resp    SpO2      Last Pain:  Vitals:   03/05/20 1006  TempSrc: Axillary  PainSc: 7       Patients Stated Pain Goal: 5 (03/05/20 1006)  Complications: No complications documented.

## 2020-03-05 NOTE — Anesthesia Postprocedure Evaluation (Signed)
Anesthesia Post Note  Patient: SHENAYA LEBO  Procedure(s) Performed: OPEN REDUCTION INTERNAL FIXATION (ORIF) MANDIBULAR FRACTURE (N/A Mouth) CLOSED REDUCTION NASAL FRACTURE (N/A Nose)     Patient location during evaluation: PACU Anesthesia Type: General Level of consciousness: awake and alert Pain management: pain level controlled Vital Signs Assessment: post-procedure vital signs reviewed and stable Respiratory status: spontaneous breathing, nonlabored ventilation, respiratory function stable and patient connected to nasal cannula oxygen Cardiovascular status: blood pressure returned to baseline and stable Postop Assessment: no apparent nausea or vomiting Anesthetic complications: no   No complications documented.  Last Vitals:  Vitals:   03/05/20 1345 03/05/20 1411  BP: 123/84 123/84  Pulse: 65 66  Resp: 13 16  Temp:  36.7 C  SpO2: 100% 100%    Last Pain:  Vitals:   03/05/20 1411  TempSrc: Oral  PainSc:                  Brodrick Curran DAVID

## 2020-03-05 NOTE — Anesthesia Preprocedure Evaluation (Signed)
Anesthesia Evaluation  Patient identified by MRN, date of birth, ID band Patient awake    Reviewed: Allergy & Precautions, NPO status , Patient's Chart, lab work & pertinent test results  Airway Mallampati: I  TM Distance: >3 FB Neck ROM: Full    Dental   Pulmonary former smoker,    Pulmonary exam normal        Cardiovascular Normal cardiovascular exam     Neuro/Psych    GI/Hepatic   Endo/Other    Renal/GU      Musculoskeletal   Abdominal   Peds  Hematology   Anesthesia Other Findings   Reproductive/Obstetrics                             Anesthesia Physical Anesthesia Plan  ASA: II  Anesthesia Plan: General   Post-op Pain Management:    Induction: Intravenous  PONV Risk Score and Plan: 3 and Ondansetron, Midazolam and Dexamethasone  Airway Management Planned: Nasal ETT  Additional Equipment:   Intra-op Plan:   Post-operative Plan: Extubation in OR  Informed Consent: I have reviewed the patients History and Physical, chart, labs and discussed the procedure including the risks, benefits and alternatives for the proposed anesthesia with the patient or authorized representative who has indicated his/her understanding and acceptance.       Plan Discussed with: CRNA and Surgeon  Anesthesia Plan Comments:         Anesthesia Quick Evaluation

## 2020-03-05 NOTE — Discharge Instructions (Addendum)
INSTRUCTIONS FOR AFTER SURGERY   You will likely have some questions about what to expect following your operation.  The following information will help you and your family understand what to expect when you are discharged from the hospital.  Following these guidelines will help ensure a smooth recovery and reduce risks of complications.  Postoperative instructions include information on: diet, wound care, medications and physical activity.  AFTER SURGERY Expect to go home after the procedure.  In some cases, you may need to spend one night in the hospital for observation.  DIET The healthier you eat the better your body can start healing. It is important to increasing your protein intake.   Liquid diet only until cleared by surgeon.   As a general rule after surgery, you should have 8 ounces of water every hour while awake. If your urine is bright yellow, then it is concentrated, and you need to drink more water.  If you find you are persistently nauseated or unable to take in liquids let us know.  NO TOBACCO USE or EXPOSURE.  This will slow your healing process and increase the risk of a wound.  WOUND CARE If you don't have a drain: You can shower the day after surgery.  Use fragrance free soap.  Dial, [place], [place] and Cetaphil are usually mild on the skin.  If you have a drain: Clean with baby wipes until the drain is removed.   If you have steri-strips / tape directly attached to your skin leave them in place. It is OK to get these wet.  No baths, pools or hot tubs for two weeks. We close your incision to leave the smallest and best-looking scar. No ointment or creams on your incisions until given the go ahead.  Especially not Neosporin (Too many skin reactions with this one).  A few weeks after surgery you can use Mederma and start massaging the scar. We ask you to wear your binder or sports bra for the first 6 weeks around the clock, including while sleeping. This provides added comfort and  helps reduce the fluid accumulation at the surgery site.  ACTIVITY No heavy lifting until cleared by the doctor.  It is OK to walk and climb stairs. In fact, moving your legs is very important to decrease your risk of a blood clot.  It will also help keep you from getting deconditioned.  Every 1 to 2 hours get up and walk for 5 minutes. This will help with a quicker recovery back to normal.  Let pain be your guide so you don't do too much.  NO, you cannot do the spring cleaning and don't plan on taking care of anyone else.  This is your time for TLC.   WORK Everyone returns to work at different times. As a rough guide, most people take at least 1 - 2 weeks off prior to returning to work. If you need documentation for your job, bring the forms to your postoperative follow up visit.  DRIVING Arrange for someone to bring you home from the hospital.  You may be able to drive a few days after surgery but not while taking any narcotics or valium.  BOWEL MOVEMENTS Constipation can occur after anesthesia and while taking pain medication.  It is important to stay ahead for your comfort.  We recommend taking Milk of Magnesia (2 tablespoons; twice a day) while taking the pain pills.  SEROMA This is fluid your body tried to put in the surgical site.  This is normal but if it creates excessive pain and swelling let us know.  It usually decreases in a few weeks.  MEDICATIONS and PAIN CONTROL At your preoperative visit for you history and physical you were given the following medications: 1. An antibiotic: Start this medication when you get home and take according to the instructions on the bottle. 2. Zofran 4 mg:  This is to treat nausea and vomiting.  You can take this every 6 hours as needed and only if needed. 3. Hycet:  This is only to be used after you have taken the motrin or the tylenol. Every 8 hours as needed. Over the counter Medication to take: 4. Ibuprofen (Motrin) 600 mg:  Take this every 6  hours.  If you have additional pain then take 500 mg of the tylenol.  Only take the Norco after you have tried these two. 5. Miralax or stool softener of choice: Take this according to the bottle if you take the Norco.  ORAL CARE Use diluted mouthwash or diluted salt water to clean your mouth 3-4 times a day.  May restart to use a soft toothbrush on Sunday after each meal.  WHEN TO CALL Call your surgeon's office if any of the following occur: . Fever 101 degrees F or greater . Excessive bleeding or fluid from the incision site. . Pain that increases over time without aid from the medications . Redness, warmth, or pus draining from incision sites . Persistent nausea or inability to take in liquids . Severe misshapen area that underwent the operation.     Post Anesthesia Home Care Instructions  Activity: Get plenty of rest for the remainder of the day. A responsible individual must stay with you for 24 hours following the procedure.  For the next 24 hours, DO NOT: -Drive a car -Advertising copywriter -Drink alcoholic beverages -Take any medication unless instructed by your physician -Make any legal decisions or sign important papers.  Meals: Start with liquid foods such as gelatin or soup. Progress to regular foods as tolerated. Avoid greasy, spicy, heavy foods. If nausea and/or vomiting occur, drink only clear liquids until the nausea and/or vomiting subsides. Call your physician if vomiting continues.  Special Instructions/Symptoms: Your throat may feel dry or sore from the anesthesia or the breathing tube placed in your throat during surgery. If this causes discomfort, gargle with warm salt water. The discomfort should disappear within 24 hours.  If you had a scopolamine patch placed behind your ear for the management of post- operative nausea and/or vomiting:  1. The medication in the patch is effective for 72 hours, after which it should be removed.  Wrap patch in a tissue and  discard in the trash. Wash hands thoroughly with soap and water. 2. You may remove the patch earlier than 72 hours if you experience unpleasant side effects which may include dry mouth, dizziness or visual disturbances. 3. Avoid touching the patch. Wash your hands with soap and water after contact with the patch.

## 2020-03-06 ENCOUNTER — Encounter (HOSPITAL_BASED_OUTPATIENT_CLINIC_OR_DEPARTMENT_OTHER): Payer: Self-pay | Admitting: Plastic Surgery

## 2020-03-17 ENCOUNTER — Telehealth: Payer: Self-pay
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
## 2020-03-18 ENCOUNTER — Encounter: Payer: Self-pay | Admitting: Surgical

## 2020-03-18 ENCOUNTER — Ambulatory Visit (INDEPENDENT_AMBULATORY_CARE_PROVIDER_SITE_OTHER): Payer: Medicaid Other | Admitting: Surgical

## 2020-03-18 ENCOUNTER — Other Ambulatory Visit: Payer: Self-pay

## 2020-03-18 VITALS — BP 95/60 | HR 95 | Temp 98.2°F

## 2020-03-18 DIAGNOSIS — S02601A Fracture of unspecified part of body of right mandible, initial encounter for closed fracture: Secondary | ICD-10-CM

## 2020-03-18 DIAGNOSIS — S022XXA Fracture of nasal bones, initial encounter for closed fracture: Secondary | ICD-10-CM
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
## 2020-03-20 ENCOUNTER — Encounter: Payer: Medicaid Other | Admitting: Plastic Surgery

## 2020-03-27 ENCOUNTER — Ambulatory Visit (INDEPENDENT_AMBULATORY_CARE_PROVIDER_SITE_OTHER): Payer: Medicaid Other | Admitting: Plastic Surgery

## 2020-03-27 ENCOUNTER — Other Ambulatory Visit: Payer: Self-pay

## 2020-03-27 ENCOUNTER — Encounter: Payer: Self-pay | Admitting: Plastic Surgery

## 2020-03-27 VITALS — BP 86/50 | HR 102 | Temp 99.1°F

## 2020-03-27 DIAGNOSIS — S02601A Fracture of unspecified part of body of right mandible, initial encounter for closed fracture: Secondary | ICD-10-CM

## 2020-03-27 MED ORDER — AMOXICILLIN-POT CLAVULANATE 500-125 MG PO TABS: 1.0000 | ORAL_TABLET | Freq: Three times a day (TID) | ORAL | 0 refills | Status: AC
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
## 2020-04-01 ENCOUNTER — Telehealth: Payer: Self-pay

## 2020-04-01 MED ORDER — AMOXICILLIN-POT CLAVULANATE 250-62.5 MG/5ML PO SUSR: 500.0000 mg | Freq: Three times a day (TID) | ORAL | 0 refills | Status: AC

## 2020-04-01 NOTE — Telephone Encounter (Signed)
Called patient to inform her that the medication has been sent to her pharmacy.

## 2020-04-01 NOTE — Telephone Encounter (Signed)
Patient called requesting the liquid form of amoxicillin to be sent to her pharmacy. She is unable to take it in tablet form.

## 2020-04-01 NOTE — Telephone Encounter (Signed)
I have resent the rx for the patient to:  [id number] - [place] ([place]), [place] - [address]  [address], [place] ([place]) [place] [id number]   Please call patient to advise

## 2020-04-06 ENCOUNTER — Other Ambulatory Visit: Payer: Self-pay

## 2020-04-06 ENCOUNTER — Encounter (HOSPITAL_BASED_OUTPATIENT_CLINIC_OR_DEPARTMENT_OTHER): Payer: Self-pay | Admitting: Plastic Surgery
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
## 2020-04-07 ENCOUNTER — Ambulatory Visit (INDEPENDENT_AMBULATORY_CARE_PROVIDER_SITE_OTHER): Payer: Medicaid Other | Admitting: Plastic Surgery

## 2020-04-07 ENCOUNTER — Encounter: Payer: Self-pay | Admitting: Plastic Surgery

## 2020-04-07 VITALS — BP 113/74 | HR 85 | Ht 63.0 in | Wt 106.2 lb

## 2020-04-07 DIAGNOSIS — S02601A Fracture of unspecified part of body of right mandible, initial encounter for closed fracture: Secondary | ICD-10-CM

## 2020-04-07 MED ORDER — ONDANSETRON HCL 4 MG PO TABS: 4.0000 mg | ORAL_TABLET | Freq: Three times a day (TID) | ORAL | 0 refills | Status: DC | PRN

## 2020-04-07 MED ORDER — HYDROCODONE-ACETAMINOPHEN 5-325 MG PO TABS: 1.0000 | ORAL_TABLET | Freq: Three times a day (TID) | ORAL | 0 refills | Status: AC | PRN

## 2020-04-07 MED ORDER — CEPHALEXIN 500 MG PO CAPS
500.0000 mg | ORAL_CAPSULE | Freq: Four times a day (QID) | ORAL | 0 refills | Status: AC
Start: 2020-04-07 — End: 2020-04-10

## 2020-04-09 ENCOUNTER — Inpatient Hospital Stay (HOSPITAL_COMMUNITY): Admission: RE | Admit: 2020-04-09 | Payer: Medicaid Other | Source: Ambulatory Visit

## 2020-04-10 ENCOUNTER — Other Ambulatory Visit (HOSPITAL_COMMUNITY)
Admission: RE | Admit: 2020-04-10 | Discharge: 2020-04-10 | Disposition: A | Discharge status: Home / Self Care | Payer: Medicaid Other | Source: Ambulatory Visit | Attending: Plastic Surgery | Admitting: Plastic Surgery

## 2020-04-10 DIAGNOSIS — Z20822 Contact with and (suspected) exposure to covid-19: Secondary | ICD-10-CM | POA: Diagnosis not present

## 2020-04-10 DIAGNOSIS — Z01812 Encounter for preprocedural laboratory examination: Secondary | ICD-10-CM | POA: Diagnosis not present

## 2020-04-10 LAB — SARS CORONAVIRUS 2 (TAT 6-24 HRS): SARS Coronavirus 2: NEGATIVE

## 2020-04-12 ENCOUNTER — Encounter: Payer: Self-pay | Admitting: Plastic Surgery

## 2020-04-13 ENCOUNTER — Ambulatory Visit (HOSPITAL_BASED_OUTPATIENT_CLINIC_OR_DEPARTMENT_OTHER): Payer: Medicaid Other | Admitting: Anesthesiology

## 2020-04-13 ENCOUNTER — Encounter (HOSPITAL_BASED_OUTPATIENT_CLINIC_OR_DEPARTMENT_OTHER): Payer: Self-pay | Admitting: Plastic Surgery

## 2020-04-13 ENCOUNTER — Encounter (HOSPITAL_BASED_OUTPATIENT_CLINIC_OR_DEPARTMENT_OTHER)
Admission: RE | Disposition: A | Discharge status: Home / Self Care | Payer: Self-pay | Source: Home / Self Care | Attending: Plastic Surgery

## 2020-04-13 ENCOUNTER — Other Ambulatory Visit: Payer: Self-pay

## 2020-04-13 ENCOUNTER — Ambulatory Visit (HOSPITAL_BASED_OUTPATIENT_CLINIC_OR_DEPARTMENT_OTHER)
Admission: RE | Admit: 2020-04-13 | Discharge: 2020-04-13 | Disposition: A | Discharge status: Home / Self Care | Payer: Medicaid Other | Attending: Plastic Surgery | Admitting: Plastic Surgery

## 2020-04-13 DIAGNOSIS — Z8614 Personal history of Methicillin resistant Staphylococcus aureus infection: Secondary | ICD-10-CM | POA: Diagnosis not present

## 2020-04-13 DIAGNOSIS — Z87891 Personal history of nicotine dependence: Secondary | ICD-10-CM | POA: Diagnosis not present

## 2020-04-13 DIAGNOSIS — Z472 Encounter for removal of internal fixation device: Secondary | ICD-10-CM

## 2020-04-13 HISTORY — PX: MANDIBULAR HARDWARE REMOVAL: SHX5205

## 2020-04-13 LAB — POCT PREGNANCY, URINE: Preg Test, Ur: NEGATIVE

## 2020-04-13 SURGERY — REMOVAL, HARDWARE, MANDIBLE
Anesthesia: General | Site: Mouth

## 2020-04-13 MED ORDER — SODIUM CHLORIDE 0.9% FLUSH: 3.0000 mL | Freq: Two times a day (BID) | INTRAVENOUS | Status: DC

## 2020-04-13 MED ORDER — LIDOCAINE-EPINEPHRINE 1 %-1:100000 IJ SOLN
INTRAMUSCULAR | Status: DC | PRN
  Administered 2020-04-13: 2 mL

## 2020-04-13 MED ORDER — FENTANYL CITRATE (PF) 100 MCG/2ML IJ SOLN
INTRAMUSCULAR | Status: DC | PRN
  Administered 2020-04-13 (×2): 50 ug via INTRAVENOUS

## 2020-04-13 MED ORDER — FENTANYL CITRATE (PF) 100 MCG/2ML IJ SOLN
INTRAMUSCULAR | Status: AC
  Filled 2020-04-13: qty 2

## 2020-04-13 MED ORDER — CEFAZOLIN SODIUM-DEXTROSE 2-4 GM/100ML-% IV SOLN
INTRAVENOUS | Status: AC
  Filled 2020-04-13: qty 100

## 2020-04-13 MED ORDER — OXYCODONE HCL 5 MG PO TABS: 5.0000 mg | ORAL_TABLET | ORAL | Status: DC | PRN

## 2020-04-13 MED ORDER — KETOROLAC TROMETHAMINE 30 MG/ML IJ SOLN
INTRAMUSCULAR | Status: AC
  Filled 2020-04-13: qty 1

## 2020-04-13 MED ORDER — PHENYLEPHRINE 40 MCG/ML (10ML) SYRINGE FOR IV PUSH (FOR BLOOD PRESSURE SUPPORT)
PREFILLED_SYRINGE | INTRAVENOUS | Status: AC
  Filled 2020-04-13: qty 10

## 2020-04-13 MED ORDER — LACTATED RINGERS IV SOLN: INTRAVENOUS | Status: DC

## 2020-04-13 MED ORDER — ACETAMINOPHEN 325 MG RE SUPP: 650.0000 mg | RECTAL | Status: DC | PRN

## 2020-04-13 MED ORDER — ACETAMINOPHEN 325 MG PO TABS: 650.0000 mg | ORAL_TABLET | ORAL | Status: DC | PRN

## 2020-04-13 MED ORDER — ONDANSETRON HCL 4 MG/2ML IJ SOLN
INTRAMUSCULAR | Status: AC
  Filled 2020-04-13: qty 2

## 2020-04-13 MED ORDER — MORPHINE SULFATE (PF) 4 MG/ML IV SOLN: 2.0000 mg | INTRAVENOUS | Status: DC | PRN

## 2020-04-13 MED ORDER — ONDANSETRON HCL 4 MG/2ML IJ SOLN
INTRAMUSCULAR | Status: DC | PRN
  Administered 2020-04-13: 4 mg via INTRAVENOUS

## 2020-04-13 MED ORDER — CEFAZOLIN SODIUM-DEXTROSE 2-4 GM/100ML-% IV SOLN
2.0000 g | INTRAVENOUS | Status: AC
  Administered 2020-04-13: 2 g via INTRAVENOUS

## 2020-04-13 MED ORDER — EPHEDRINE 5 MG/ML INJ
INTRAVENOUS | Status: AC
  Filled 2020-04-13: qty 10

## 2020-04-13 MED ORDER — CHLORHEXIDINE GLUCONATE CLOTH 2 % EX PADS: 6.0000 | MEDICATED_PAD | Freq: Once | CUTANEOUS | Status: DC

## 2020-04-13 MED ORDER — SODIUM CHLORIDE 0.9% FLUSH: 3.0000 mL | INTRAVENOUS | Status: DC | PRN

## 2020-04-13 MED ORDER — PROPOFOL 10 MG/ML IV BOLUS
INTRAVENOUS | Status: DC | PRN
  Administered 2020-04-13: 150 mg via INTRAVENOUS

## 2020-04-13 MED ORDER — KETOROLAC TROMETHAMINE 30 MG/ML IJ SOLN
30.0000 mg | Freq: Once | INTRAMUSCULAR | Status: AC | PRN
  Administered 2020-04-13: 30 mg via INTRAVENOUS

## 2020-04-13 MED ORDER — MIDAZOLAM HCL 2 MG/2ML IJ SOLN
INTRAMUSCULAR | Status: AC
  Filled 2020-04-13: qty 2

## 2020-04-13 MED ORDER — MIDAZOLAM HCL 5 MG/5ML IJ SOLN
INTRAMUSCULAR | Status: DC | PRN
  Administered 2020-04-13: 2 mg via INTRAVENOUS

## 2020-04-13 MED ORDER — FENTANYL CITRATE (PF) 100 MCG/2ML IJ SOLN
25.0000 ug | INTRAMUSCULAR | Status: DC | PRN
  Administered 2020-04-13: 25 ug via INTRAVENOUS

## 2020-04-13 MED ORDER — LIDOCAINE HCL (CARDIAC) PF 100 MG/5ML IV SOSY
PREFILLED_SYRINGE | INTRAVENOUS | Status: DC | PRN
  Administered 2020-04-13: 50 mg via INTRAVENOUS

## 2020-04-13 MED ORDER — SODIUM CHLORIDE 0.9 % IV SOLN: 250.0000 mL | INTRAVENOUS | Status: DC | PRN

## 2020-04-13 MED ORDER — SUCCINYLCHOLINE CHLORIDE 200 MG/10ML IV SOSY
PREFILLED_SYRINGE | INTRAVENOUS | Status: AC
  Filled 2020-04-13: qty 10

## 2020-04-13 MED ORDER — LIDOCAINE 2% (20 MG/ML) 5 ML SYRINGE
INTRAMUSCULAR | Status: AC
  Filled 2020-04-13: qty 5

## 2020-04-13 SURGICAL SUPPLY — 34 items
ADH SKN CLS APL DERMABOND .7 (GAUZE/BANDAGES/DRESSINGS)
BLADE SURG 15 STRL LF DISP TIS (BLADE) ×1 IMPLANT
BLADE SURG 15 STRL SS (BLADE) ×2
CANISTER SUCT 1200ML W/VALVE (MISCELLANEOUS) ×2 IMPLANT
COVER MAYO STAND STRL (DRAPES) IMPLANT
COVER WAND RF STERILE (DRAPES) IMPLANT
DECANTER SPIKE VIAL GLASS SM (MISCELLANEOUS) ×1 IMPLANT
DERMABOND ADVANCED (GAUZE/BANDAGES/DRESSINGS)
DERMABOND ADVANCED .7 DNX12 (GAUZE/BANDAGES/DRESSINGS) IMPLANT
ELECT COATED BLADE 2.86 ST (ELECTRODE) IMPLANT
ELECT REM PT RETURN 9FT ADLT (ELECTROSURGICAL)
ELECTRODE REM PT RTRN 9FT ADLT (ELECTROSURGICAL) IMPLANT
GAUZE SPONGE 4X4 12PLY STRL LF (GAUZE/BANDAGES/DRESSINGS) ×2 IMPLANT
GLOVE BIO SURGEON STRL SZ 6.5 (GLOVE) ×3 IMPLANT
GLOVE BIOGEL PI IND STRL 7.0 (GLOVE) IMPLANT
GLOVE BIOGEL PI INDICATOR 7.0 (GLOVE) ×1
GOWN STRL REUS W/ TWL LRG LVL3 (GOWN DISPOSABLE) ×2 IMPLANT
GOWN STRL REUS W/TWL LRG LVL3 (GOWN DISPOSABLE) ×6
MARKER SKIN DUAL TIP RULER LAB (MISCELLANEOUS) IMPLANT
NDL PRECISIONGLIDE 27X1.5 (NEEDLE) ×1 IMPLANT
NDL SAFETY ECLIPSE 18X1.5 (NEEDLE) IMPLANT
NEEDLE HYPO 18GX1.5 SHARP (NEEDLE) ×2
NEEDLE PRECISIONGLIDE 27X1.5 (NEEDLE) ×2 IMPLANT
PACK BASIN DAY SURGERY FS (CUSTOM PROCEDURE TRAY) ×2 IMPLANT
PENCIL FOOT CONTROL (ELECTRODE) IMPLANT
SCISSORS WIRE ANG 4 3/4 DISP (INSTRUMENTS) IMPLANT
SHEET MEDIUM DRAPE 40X70 STRL (DRAPES) ×2 IMPLANT
SUT CHROMIC 3 0 PS 2 (SUTURE) IMPLANT
SUT CHROMIC 4 0 PS 2 18 (SUTURE) IMPLANT
SYR CONTROL 10ML LL (SYRINGE) ×2 IMPLANT
TOWEL GREEN STERILE FF (TOWEL DISPOSABLE) ×2 IMPLANT
TRAY DSU PREP LF (CUSTOM PROCEDURE TRAY) IMPLANT
TUBE CONNECTING 20X1/4 (TUBING) ×2 IMPLANT
YANKAUER SUCT BULB TIP NO VENT (SUCTIONS) ×2 IMPLANT

## 2020-04-13 NOTE — Anesthesia Preprocedure Evaluation (Addendum)
Anesthesia Evaluation  Patient identified by MRN, date of birth, ID band Patient awake    Reviewed: Allergy & Precautions, NPO status , Patient's Chart, lab work & pertinent test results  Airway Mallampati: II  TM Distance: >3 FB Neck ROM: Full    Dental no notable dental hx.    Pulmonary neg pulmonary ROS, former smoker,    Pulmonary exam normal breath sounds clear to auscultation       Cardiovascular negative cardio ROS Normal cardiovascular exam Rhythm:Regular Rate:Normal     Neuro/Psych negative neurological ROS  negative psych ROS   GI/Hepatic negative GI ROS, Neg liver ROS,   Endo/Other  negative endocrine ROS  Renal/GU negative Renal ROS  negative genitourinary   Musculoskeletal negative musculoskeletal ROS (+)   Abdominal   Peds negative pediatric ROS (+)  Hematology negative hematology ROS (+)   Anesthesia Other Findings   Reproductive/Obstetrics negative OB ROS                             Anesthesia Physical Anesthesia Plan  ASA: II  Anesthesia Plan: MAC   Post-op Pain Management:    Induction: Intravenous  PONV Risk Score and Plan: 3 and Ondansetron  Airway Management Planned: Mask  Additional Equipment:   Intra-op Plan:   Post-operative Plan:   Informed Consent: I have reviewed the patients History and Physical, chart, labs and discussed the procedure including the risks, benefits and alternatives for the proposed anesthesia with the patient or authorized representative who has indicated his/her understanding and acceptance.     Dental advisory given  Plan Discussed with: CRNA and Surgeon  Anesthesia Plan Comments:        Anesthesia Quick Evaluation

## 2020-04-13 NOTE — Transfer of Care (Signed)
Immediate Anesthesia Transfer of Care Note  Patient: Shannon Lewis  Procedure(s) Performed: MAXILLOMANDIBULAR HARDWARE REMOVAL (N/A Mouth)  Patient Location: PACU  Anesthesia Type:General  Level of Consciousness: awake, alert , oriented and drowsy  Airway & Oxygen Therapy: Patient Spontanous Breathing and Patient connected to face mask oxygen  Post-op Assessment: Report given to RN and Post -op Vital signs reviewed and stable  Post vital signs: Reviewed and stable  Last Vitals:  Vitals Value Taken Time  BP    Temp    Pulse 86 04/13/20 1312  Resp 24 04/13/20 1312  SpO2 96 % 04/13/20 1312  Vitals shown include unvalidated device data.  Last Pain:  Vitals:   04/13/20 1052  PainSc: 0-No pain      Patients Stated Pain Goal: 3 (04/13/20 1052)  Complications: No complications documented.

## 2020-04-13 NOTE — Interval H&P Note (Signed)
History and Physical Interval Note:  04/13/2020 12:06 PM  Shannon Lewis  has presented today for surgery, with the diagnosis of mandible fracture.  The various methods of treatment have been discussed with the patient and family. After consideration of risks, benefits and other options for treatment, the patient has consented to  Procedure(s) with comments: MANDIBULAR HARDWARE REMOVAL (N/A) - 30 min, please as a surgical intervention.  The patient's history has been reviewed, patient examined, no change in status, stable for surgery.  I have reviewed the patient's chart and labs.  Questions were answered to the patient's satisfaction.     [name] [name]

## 2020-04-13 NOTE — Op Note (Signed)
Operative Note   DATE OF OPERATION: 04/13/2020  LOCATION: [facility]  SURGICAL DIVISION: Plastic Surgery  PREOPERATIVE DIAGNOSES:  Maxillomandibular fixation for mandible fracture  POSTOPERATIVE DIAGNOSES:  same  PROCEDURE:  Removal of maxillomandibular hardware  SURGEON: Wayland Denis, DO  ASSISTANT: Joni Fears, PA  ANESTHESIA:  General.   COMPLICATIONS: None.   INDICATIONS FOR PROCEDURE:  The patient, Shannon Lewis is a 24 y.o. female born on 23-Aug-1996, is here for treatment of a mandible fracture with MMF. MRN: [id number]  CONSENT:  Informed consent was obtained directly from the patient. Risks, benefits and alternatives were fully discussed. Specific risks including but not limited to bleeding, infection, hematoma, seroma, scarring, pain, infection, asymmetry, wound healing problems, and need for further surgery were all discussed. The patient did have an ample opportunity to have questions answered to satisfaction.   DESCRIPTION OF PROCEDURE:  The patient was taken to the operating room. SCDs were placed. The patient's operative site was prepped and draped in a sterile fashion. A time out was performed and all information was confirmed to be correct.  Anesthesia was administered.  The maxillomandibular fixation hardware was removed completely.  There were 4 screws.  There was good alignment of the dentition.  The patient tolerated the procedure well.  There were no complications. The patient was allowed to wake from anesthesia and taken to the recovery room in satisfactory condition.   The advanced practice practitioner (APP) assisted throughout the case.  The APP was essential in retraction and counter traction when needed to make the case progress smoothly.  This retraction and assistance made it possible to see the tissue plans for the procedure.  The assistance was needed for blood control, tissue re-approximation and assisted with closure of the  incision site.

## 2020-04-13 NOTE — Discharge Instructions (Addendum)
Next dose of Motrin/Ibuprofen can be given at 8:00pm if needed.    INSTRUCTIONS FOR AFTER SURGERY   You will likely have some questions about what to expect following your operation.  The following information will help you and your family understand what to expect when you are discharged from the hospital.  Following these guidelines will help ensure a smooth recovery and reduce risks of complications.  Postoperative instructions include information on: diet, wound care, medications and physical activity.  AFTER SURGERY Expect to go home after the procedure.  In some cases, you may need to spend one night in the hospital for observation.  DIET Continue with a soft diet for 2 weeks.  Increase firmness of your food slowly.  Strict oral hygiene.  If your urine is bright yellow, then it is concentrated, and you need to drink more water.  As a general rule after surgery, you should have 8 ounces of water every hour while awake.  If you find you are persistently nauseated or unable to take in liquids let us know.  NO TOBACCO USE or EXPOSURE.  This will slow your healing process and increase the risk of a wound.  WOUND CARE You can shower the day after surgery.  Use fragrance free soap.  Dial, Dove, [place] and Cetaphil are usually mild on the skin.  We close your incision to leave the smallest and best-looking scar. No ointment or creams on your incisions until given the go ahead.  Especially not Neosporin (Too many skin reactions with this one).  A few weeks after surgery you can use Mederma and start massaging the scar.  ACTIVITY No heavy lifting until cleared by the doctor.  It is OK to walk and climb stairs. In fact, moving your legs is very important to decrease your risk of a blood clot.  It will also help keep you from getting deconditioned.  Every 1 to 2 hours get up and walk for 5 minutes. This will help with a quicker recovery back to normal.  Let pain be your guide so you don't do too much.   NO, you cannot do the spring cleaning and don't plan on taking care of anyone else.  This is your time for TLC.   WORK Everyone returns to work at different times. As a rough guide, most people take at least 1 - 2 weeks off prior to returning to work. If you need documentation for your job, bring the forms to your postoperative follow up visit.  DRIVING Arrange for someone to bring you home from the hospital.  You may be able to drive a few days after surgery but not while taking any narcotics or valium.  BOWEL MOVEMENTS Constipation can occur after anesthesia and while taking pain medication.  It is important to stay ahead for your comfort.  We recommend taking Milk of Magnesia (2 tablespoons; twice a day) while taking the pain pills.  SEROMA This is fluid your body tried to put in the surgical site.  This is normal but if it creates excessive pain and swelling let us know.  It usually decreases in a few weeks.  MEDICATIONS and PAIN CONTROL At your preoperative visit for you history and physical you were given the following medications: 1. An antibiotic: Start this medication when you get home and take according to the instructions on the bottle. 2. Zofran 4 mg:  This is to treat nausea and vomiting.  You can take this every 6 hours as needed and  only if needed. 3. Norco (hydrocodone/acetaminophen) 5/325 mg:  This is only to be used after you have taken the motrin or the tylenol. Every 8 hours as needed. Over the counter Medication to take: 4. Ibuprofen (Motrin) 600 mg:  Take this every 6 hours.  If you have additional pain then take 500 mg of the tylenol.  Only take the Norco after you have tried these two. 5. Miralax or stool softener of choice: Take this according to the bottle if you take the Norco.  WHEN TO CALL Call your surgeon's office if any of the following occur: . Fever 101 degrees F or greater . Excessive bleeding or fluid from the incision site. . Pain that increases over  time without aid from the medications . Redness, warmth, or pus draining from incision sites . Persistent nausea or inability to take in liquids . Severe misshapen area that underwent the operation.   NO MOTRIN UNTIL 9:45 PM    Post Anesthesia Home Care Instructions  Activity: Get plenty of rest for the remainder of the day. A responsible individual must stay with you for 24 hours following the procedure.  For the next 24 hours, DO NOT: -Drive a car -Advertising copywriter -Drink alcoholic beverages -Take any medication unless instructed by your physician -Make any legal decisions or sign important papers.  Meals: Start with liquid foods such as gelatin or soup. Progress to regular foods as tolerated. Avoid greasy, spicy, heavy foods. If nausea and/or vomiting occur, drink only clear liquids until the nausea and/or vomiting subsides. Call your physician if vomiting continues.  Special Instructions/Symptoms: Your throat may feel dry or sore from the anesthesia or the breathing tube placed in your throat during surgery. If this causes discomfort, gargle with warm salt water. The discomfort should disappear within 24 hours.  If you had a scopolamine patch placed behind your ear for the management of post- operative nausea and/or vomiting:  1. The medication in the patch is effective for 72 hours, after which it should be removed.  Wrap patch in a tissue and discard in the trash. Wash hands thoroughly with soap and water. 2. You may remove the patch earlier than 72 hours if you experience unpleasant side effects which may include dry mouth, dizziness or visual disturbances. 3. Avoid touching the patch. Wash your hands with soap and water after contact with the patch.

## 2020-04-13 NOTE — Anesthesia Postprocedure Evaluation (Signed)
Anesthesia Post Note  Patient: Shannon Lewis  Procedure(s) Performed: MAXILLOMANDIBULAR HARDWARE REMOVAL (N/A Mouth)     Patient location during evaluation: PACU Anesthesia Type: General Level of consciousness: awake and alert Pain management: pain level controlled Vital Signs Assessment: post-procedure vital signs reviewed and stable Respiratory status: spontaneous breathing, nonlabored ventilation, respiratory function stable and patient connected to nasal cannula oxygen Cardiovascular status: stable and blood pressure returned to baseline Postop Assessment: no apparent nausea or vomiting Anesthetic complications: no   No complications documented.  Last Vitals:  Vitals:   04/13/20 1400 04/13/20 1418  BP: (!) 121/91 113/83  Pulse: 64 70  Resp: 18 18  Temp:  36.6 C  SpO2: 96% 94%    Last Pain:  Vitals:   04/13/20 1418  PainSc: 4                  Laurella Tull

## 2020-04-14 ENCOUNTER — Encounter (HOSPITAL_BASED_OUTPATIENT_CLINIC_OR_DEPARTMENT_OTHER): Payer: Self-pay | Admitting: Plastic Surgery

## 2020-04-28 ENCOUNTER — Encounter: Payer: Medicaid Other | Admitting: Plastic Surgery

## 2020-05-04 NOTE — Progress Notes (Signed)
Subjective:     Patient ID: Shannon Lewis, female    DOB: 1995-10-22, 24 y.o.   MRN: [id number]  Chief Complaint  Patient presents with  . Post-op Follow-up    HPI: The patient is a 24 y.o. female here for follow-up after undergoing removal of maxillomandibular fixation for mandible fracture on 04/13/20 with Dr. Ulice Bold.  Patient reports she is doing well overall. Numbness of lip and gum has improved. Mild occassional pain. Reports a bottom tooth that was near the wire has shifted a little (used to stick out more) and chipped an upper tooth. Reports bite is aligned, no issues with eating. States she hasn't seen a dentist since the end of 6th grade. Gums are healing well after hardware removal. No signs of infection or swelling. Overall bite appears aligned.   Review of Systems  Constitutional: Negative for chills and fever.  HENT: Positive for dental problem (bottom tooth shifted and chipped upper tooth). Negative for congestion, mouth sores and rhinorrhea.   Respiratory: Negative for shortness of breath.   Cardiovascular: Negative for chest pain.  Gastrointestinal: Negative for constipation, diarrhea, nausea and vomiting.  Skin: Negative for color change, pallor and rash.     Objective:   Vital Signs BP 110/67 (BP Location: Left Arm, Patient Position: Sitting, Cuff Size: Normal)   Pulse (!) 106   Temp (!) 97.5 F (36.4 C) (Oral)   LMP 04/06/2020   SpO2 94%  Vital Signs and Nursing Note Reviewed  Physical Exam Constitutional:      General: She is not in acute distress.    Appearance: Normal appearance. She is normal weight. She is not ill-appearing.  HENT:     Head: Normocephalic and atraumatic.     Jaw: No tenderness or swelling.     Comments: Bottom tooth on right shifted (per patient sticking out less) than before and has chipped upper tooth. Patient has poor dentition.  No signs of infection or swelling. Eyes:     Extraocular Movements: Extraocular movements  intact.  Cardiovascular:     Rate and Rhythm: Normal rate.  Pulmonary:     Effort: Pulmonary effort is normal.  Musculoskeletal:        General: Normal range of motion.     Cervical back: Normal range of motion.  Skin:    General: Skin is warm and dry.     Coloration: Skin is not pale.     Findings: No erythema or rash.  Neurological:     Mental Status: She is alert and oriented to person, place, and time.     Gait: Gait is intact.  Psychiatric:        Mood and Affect: Mood and affect normal.        Cognition and Memory: Memory normal.        Judgment: Judgment normal.       Assessment/Plan:     ICD-10-CM   1. Closed fracture of right side of mandibular body with routine healing, subsequent encounter  S02.601D    Ms. Edison is doing well overall. No issues with chewing. No signs of infection or swelling. Gum tissue healing nicely. One bottom right tooth that was near the fracture has shifted slightly and chipped upper tooth. Patient has existing poor dentition. Recommend patient see a dentist for this and for overall dental health.   Follow up as needed. Call office with any questions/concerns.   The 21st Century Cures Act was signed into law in 2016 which includes the  topic of electronic health records.  This provides immediate access to information in MyChart.  This includes consultation notes, operative notes, office notes, lab results and pathology reports.  If you have any questions about what you read please let us know at your next visit or call us at the office.  We are right here with you.   Eldridge Abrahams, PA-C 05/05/2020, 9:29 AM

## 2020-05-05 ENCOUNTER — Other Ambulatory Visit: Payer: Self-pay

## 2020-05-05 ENCOUNTER — Encounter: Payer: Self-pay | Admitting: Plastic Surgery

## 2020-05-05 ENCOUNTER — Ambulatory Visit (INDEPENDENT_AMBULATORY_CARE_PROVIDER_SITE_OTHER): Payer: Medicaid Other | Admitting: Plastic Surgery

## 2020-05-05 VITALS — BP 110/67 | HR 106 | Temp 97.5°F

## 2020-05-05 DIAGNOSIS — S02601D Fracture of unspecified part of body of right mandible, subsequent encounter for fracture with routine healing: Secondary | ICD-10-CM

## 2020-05-13 ENCOUNTER — Ambulatory Visit (INDEPENDENT_AMBULATORY_CARE_PROVIDER_SITE_OTHER): Payer: Medicaid Other

## 2020-05-13 ENCOUNTER — Other Ambulatory Visit: Payer: Self-pay

## 2020-05-13 DIAGNOSIS — Z3202 Encounter for pregnancy test, result negative: Secondary | ICD-10-CM | POA: Diagnosis not present

## 2020-05-13 LAB — POCT URINE PREGNANCY: Preg Test, Ur: NEGATIVE

## 2020-05-13 NOTE — Progress Notes (Signed)
Ms. Sandstrom presents today for UPT. She has no unusual complaints and complains of not having any period since LMP: 04/04/2020.     OBJECTIVE: Appears well, in no apparent distress.  OB History    Gravida  2   Para  1   Term  1   Preterm      AB  1   Living  1     SAB      TAB  1   Ectopic      Multiple  0   Live Births  1          Home UPT Result:NEGATIVE In-Office UPT result:NEGATIVE X2  I have reviewed the patient's medical, obstetrical, social, and family histories, and medications.   ASSESSMENT: NEGATIVE pregnancy test  PLAN Continue to monitor periods, and UPT check at home in 2-3 weeks.

## 2020-07-02 ENCOUNTER — Other Ambulatory Visit: Payer: Medicaid Other

## 2020-07-02 DIAGNOSIS — Z20822 Contact with and (suspected) exposure to covid-19: Secondary | ICD-10-CM

## 2020-07-03 LAB — NOVEL CORONAVIRUS, NAA: SARS-CoV-2, NAA: NOT DETECTED

## 2020-07-03 LAB — SARS-COV-2, NAA 2 DAY TAT

## 2021-08-14 IMAGING — CT CT MAXILLOFACIAL W/O CM
3 series · 15 of 47 positions shown, 18 images · non-contrast
Comparison: None.

CLINICAL DATA: Trip and fall, facial trauma, jaw pain, swelling to
left eye

EXAM:
CT MAXILLOFACIAL WITHOUT CONTRAST
TECHNIQUE: Multidetector CT imaging of the maxillofacial structures was
performed. Multiplanar CT image reconstructions were also generated.

[Series 3: facial/ orbits 2.0 h30s · axial · 0.38mm/px · z∈[-238,-100]mm · 9 of 81 slices shown, 12 images]
[im 6/81  brain]
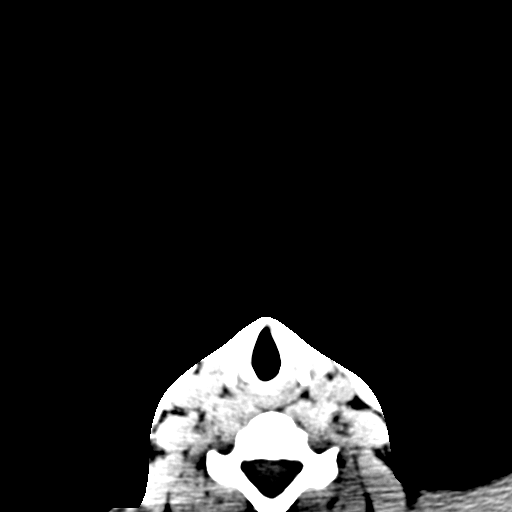
[im 6/81  bone]
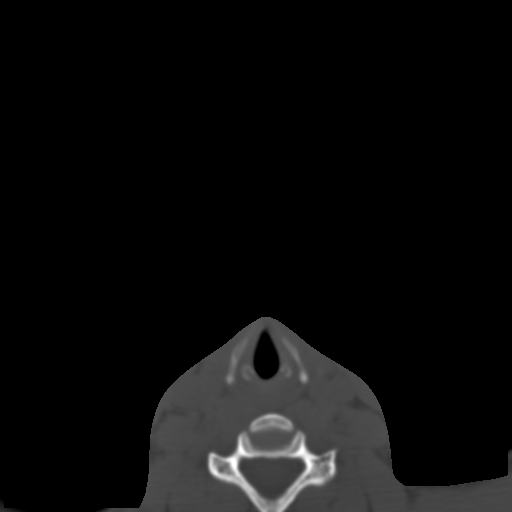
[im 14/81  bone]
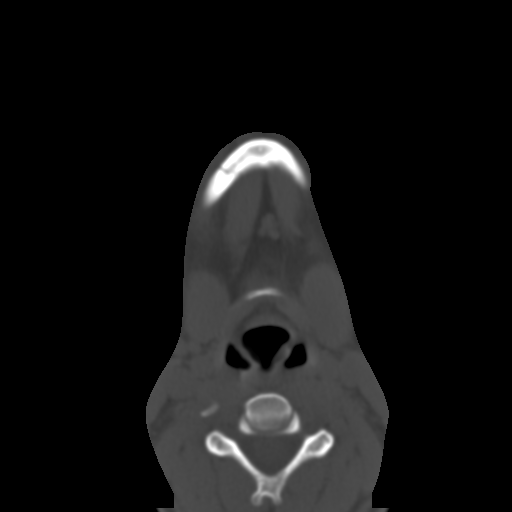
[im 23/81  bone]
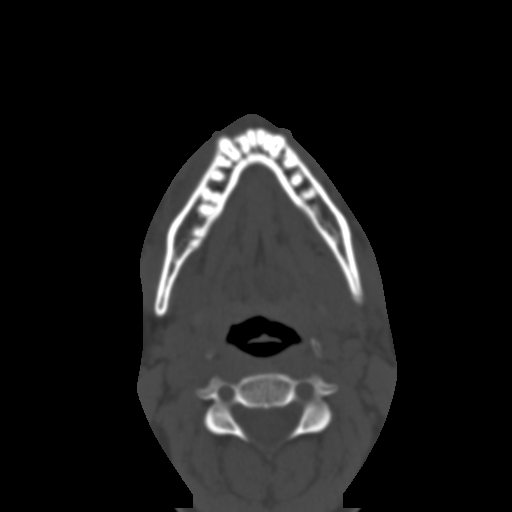
[im 31/81  bone]
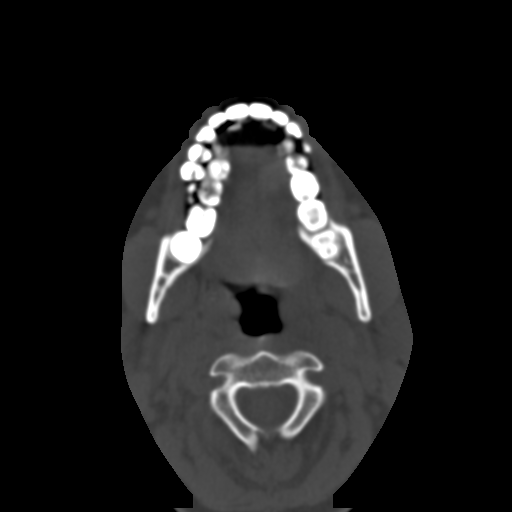
[im 42/81  brain]
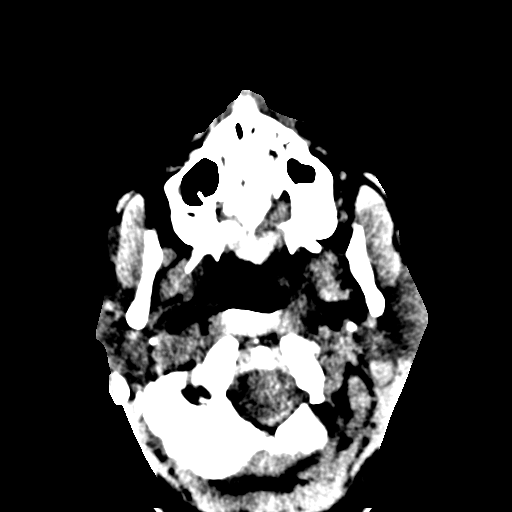
[im 42/81  bone]
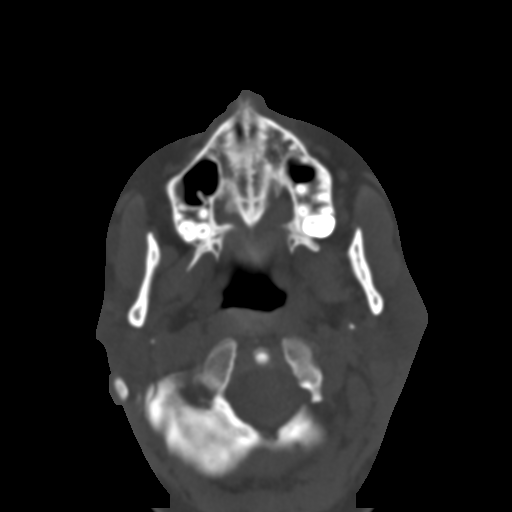
[im 50/81  bone]
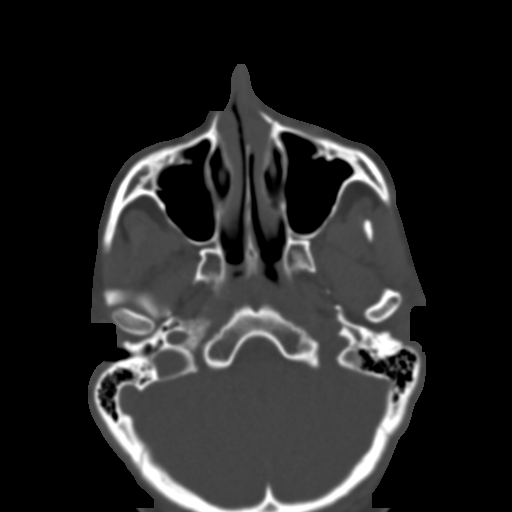
[im 58/81  bone]
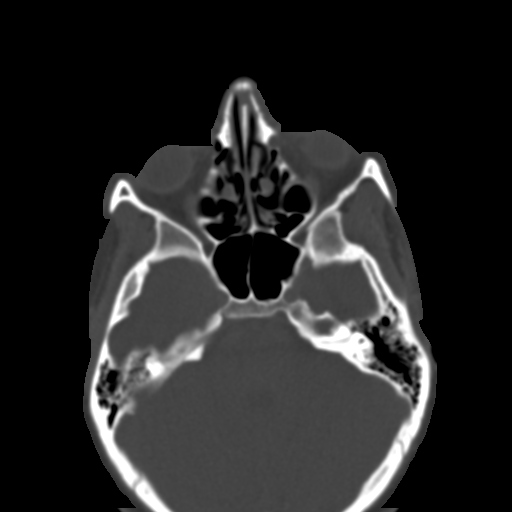
[im 67/81  bone]
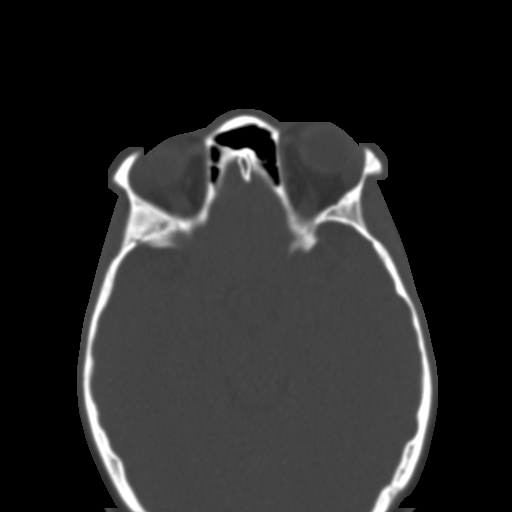
[im 75/81  brain]
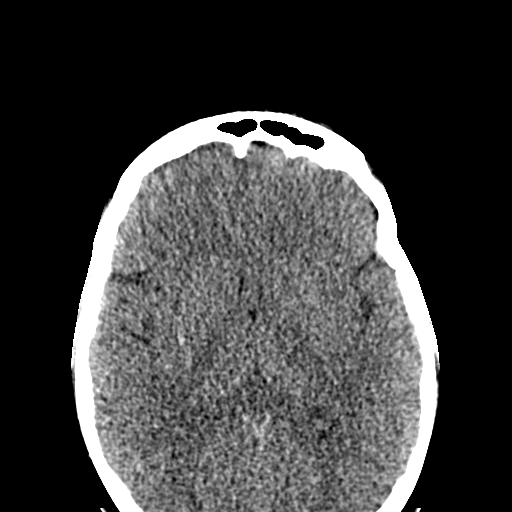
[im 75/81  bone]
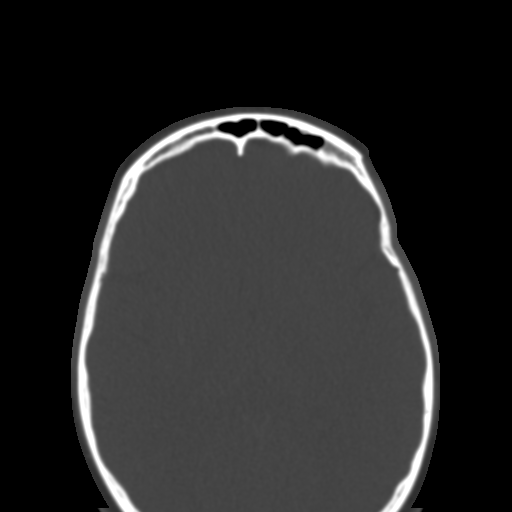

[Series 7: coronal soft tissue · coronal · 0.31mm/px · 3 of 81 slices shown]
[im 27/81  bone]
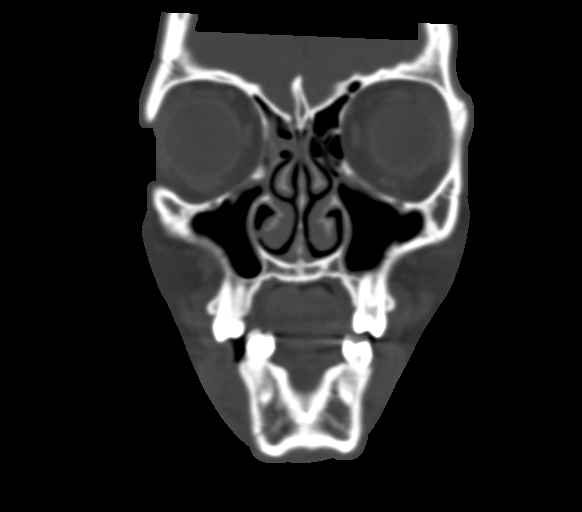
[im 36/81  bone]
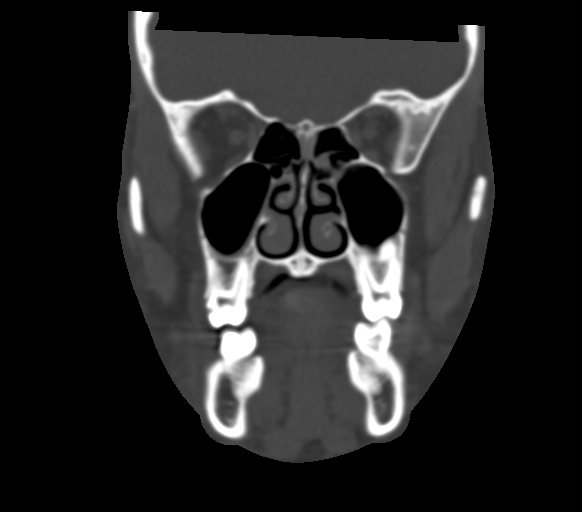
[im 45/81  bone]
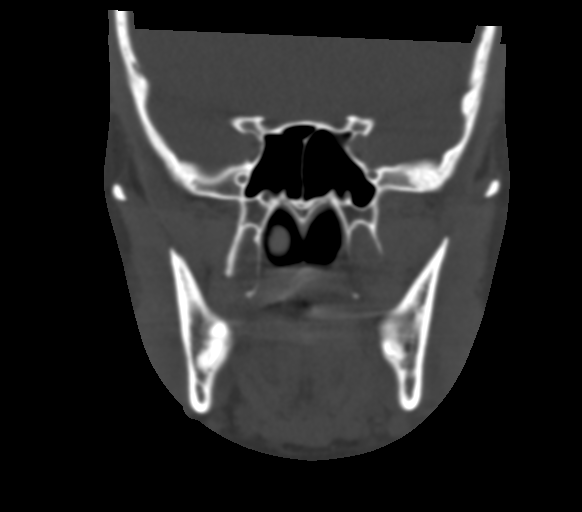

[Series 8: sagittal soft tissue · sagittal · 0.31mm/px · 3 of 76 slices shown]
[im 26/76  bone]
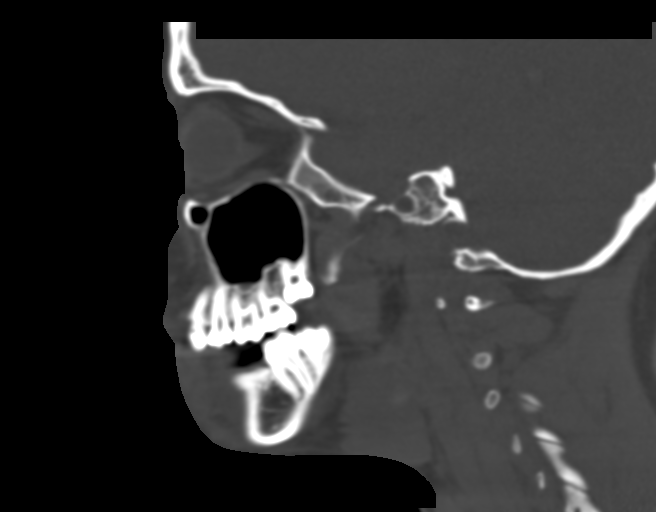
[im 38/76  bone]
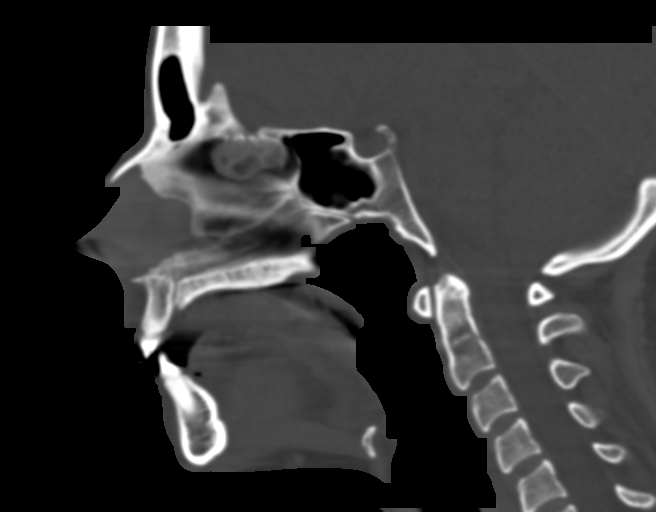
[im 51/76  bone]
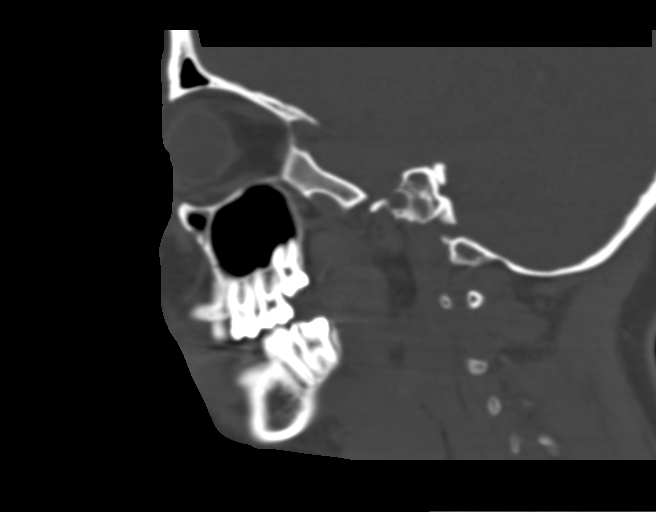

[15 of 47 positions shown; findings below may reference images not displayed]

FINDINGS: Osseous: Mildly displaced fractures of the left nasal bones.
Nondisplaced oblique fracture of the right anterior right mandibular
body, near the symphysis, which traverses the socket of the right
mandibular cuspid (series 9, image 25). Temporomandibular joints are
intact.

Orbits: Negative. No traumatic or inflammatory finding.

Sinuses: Clear.

Soft tissues: Soft tissue edema over the chin and nose.

Limited intracranial: No significant or unexpected finding.
IMPRESSION: 1. Mildly displaced fractures of the left nasal bones.
2. Nondisplaced oblique fracture of the right anterior mandibular
body, near the symphysis, which traverses the socket of the right
mandibular cuspid.
3. Soft tissue edema over the chin and nose.

## 2024-03-11 ENCOUNTER — Ambulatory Visit (INDEPENDENT_AMBULATORY_CARE_PROVIDER_SITE_OTHER)

## 2024-03-11 VITALS — BP 117/74 | HR 66

## 2024-03-11 DIAGNOSIS — Z3201 Encounter for pregnancy test, result positive: Secondary | ICD-10-CM | POA: Diagnosis not present

## 2024-03-11 LAB — POCT URINE PREGNANCY: Preg Test, Ur: POSITIVE — AB

## 2024-03-11 MED ORDER — PRENATAL 28-0.8 MG PO TABS: 1.0000 | ORAL_TABLET | Freq: Every day | ORAL | 12 refills | Status: DC

## 2024-03-11 NOTE — Progress Notes (Signed)
 Shannon Lewis R Ayon here for a UPT. Pt had a positive upt at home. LMP is 01/24/24.     UPT in office Positive.    Reviewed medications and informed to start a PNV, if not already. Pt to follow up in 2 weeks for New OB Intake visit.

## 2024-03-14 ENCOUNTER — Inpatient Hospital Stay (HOSPITAL_COMMUNITY)

## 2024-03-14 ENCOUNTER — Encounter (HOSPITAL_COMMUNITY): Payer: Self-pay | Admitting: Family Medicine

## 2024-03-14 ENCOUNTER — Telehealth: Payer: Self-pay

## 2024-03-14 ENCOUNTER — Inpatient Hospital Stay (HOSPITAL_COMMUNITY)
Admission: AD | Admit: 2024-03-14 | Discharge: 2024-03-14 | Disposition: A | Discharge status: Home / Self Care | Attending: Family Medicine | Admitting: Family Medicine

## 2024-03-14 DIAGNOSIS — Z3A01 Less than 8 weeks gestation of pregnancy: Secondary | ICD-10-CM | POA: Diagnosis not present

## 2024-03-14 DIAGNOSIS — Z113 Encounter for screening for infections with a predominantly sexual mode of transmission: Secondary | ICD-10-CM | POA: Insufficient documentation

## 2024-03-14 DIAGNOSIS — B9689 Other specified bacterial agents as the cause of diseases classified elsewhere: Secondary | ICD-10-CM | POA: Diagnosis not present

## 2024-03-14 DIAGNOSIS — O209 Hemorrhage in early pregnancy, unspecified: Secondary | ICD-10-CM

## 2024-03-14 DIAGNOSIS — O23591 Infection of other part of genital tract in pregnancy, first trimester: Secondary | ICD-10-CM | POA: Insufficient documentation

## 2024-03-14 LAB — CBC
HCT: 39.8 % (ref 36.0–46.0)
Hemoglobin: 13.2 g/dL (ref 12.0–15.0)
MCH: 31 pg (ref 26.0–34.0)
MCHC: 33.2 g/dL (ref 30.0–36.0)
MCV: 93.4 fL (ref 80.0–100.0)
Platelets: 228 10*3/uL (ref 150–400)
RBC: 4.26 MIL/uL (ref 3.87–5.11)
RDW: 13.4 % (ref 11.5–15.5)
WBC: 12 10*3/uL — ABNORMAL HIGH (ref 4.0–10.5)
nRBC: 0 % (ref 0.0–0.2)

## 2024-03-14 LAB — HCG, QUANTITATIVE, PREGNANCY: hCG, Beta Chain, Quant, S: 16099 m[IU]/mL — ABNORMAL HIGH (ref <5)

## 2024-03-14 LAB — WET PREP, GENITAL
Sperm: NONE SEEN
Trich, Wet Prep: NONE SEEN
WBC, Wet Prep HPF POC: >=10 — AB (ref <10)
Yeast Wet Prep HPF POC: NONE SEEN

## 2024-03-14 MED ORDER — METRONIDAZOLE 500 MG PO TABS: 500.0000 mg | ORAL_TABLET | Freq: Two times a day (BID) | ORAL | 0 refills | Status: DC

## 2024-03-14 NOTE — MAU Note (Addendum)
 Shannon Lewis is a 28 y.o. at [redacted]w[redacted]d here in MAU reporting: started spotting, light pink when wiped.  About 0500, started cramping and passed a thin, nickel sized clot. Had cramping on and off through out the day.had some bleeding small amt later in the day, never soaked a pad. Finally stopped this morning.  LMP: 5/7 Onset of complaint: Tues Pain score: 4, off and on Vitals:   03/14/24 1355  BP: (!) 110/58  Pulse: 79  Resp: 16  Temp: 98.3 F (36.8 C)  SpO2: 98%      Lab orders placed from triage:   Blood work drawn in triage.  Paged US  when hx updated

## 2024-03-14 NOTE — Telephone Encounter (Signed)
 Pt reports light bleeding, that has increased and is now darker.   Call returned to patient. Advised that she present to MAU for further evaluation of her bleeding. Pt verbalized understanding.

## 2024-03-14 NOTE — MAU Provider Note (Signed)
 History     CSN: [id number]  Arrival date and time: 03/14/24 1246   None     Chief Complaint  Patient presents with   Vaginal Bleeding   Abdominal Pain   HPI Patient presenting for evaluation for vaginal bleeding in early pregnancy.  Reports that she first noted spotting yesterday morning which then progressed to passing heavier blood and then small clots.  She bled off and on throughout the day but reports she never fully soaked a pad.  Positive pregnancy test on the 18th of this month.  Denies any recent intercourse with last intercourse being approximately 2 weeks ago.  Reported mild bleeding with her previous pregnancy but nothing like this so she wanted to be evaluated.  OB History     Gravida  3   Para  1   Term  1   Preterm      AB  1   Living  1      SAB      IAB  1   Ectopic      Multiple  0   Live Births  1           Past Medical History:  Diagnosis Date   Anemia    Infection    UTI   MRSA (methicillin resistant Staphylococcus aureus) infection 2015    Past Surgical History:  Procedure Laterality Date   CLOSED REDUCTION NASAL FRACTURE N/A 03/05/2020   Procedure: CLOSED REDUCTION NASAL FRACTURE;  Surgeon: Lowery Estefana RAMAN, DO;  Location: [facility];  Service: Plastics;  Laterality: N/A;   MANDIBULAR HARDWARE REMOVAL N/A 04/13/2020   Procedure: MAXILLOMANDIBULAR HARDWARE REMOVAL;  Surgeon: Lowery Estefana RAMAN, DO;  Location: [facility];  Service: Plastics;  Laterality: N/A;  30 min, please   ORIF MANDIBULAR FRACTURE N/A 03/05/2020   Procedure: OPEN REDUCTION INTERNAL FIXATION (ORIF) MANDIBULAR FRACTURE;  Surgeon: Lowery Estefana RAMAN, DO;  Location: [facility];  Service: Plastics;  Laterality: N/A;   THERAPEUTIC ABORTION      Family History  Problem Relation Age of Onset   COPD Mother    Asthma Mother    Asthma Father    COPD Father    Hypertension Father     Social History    Tobacco Use   Smoking status: Former    Current packs/day: 0.25    Average packs/day: 0.3 packs/day for 7.0 years (1.8 ttl pk-yrs)    Types: Cigarettes   Smokeless tobacco: Never   Tobacco comments:    quit in March  Vaping Use   Vaping status: Never Used  Substance Use Topics   Alcohol use: Not Currently    Comment: occasional   Drug use: Never    Allergies: No Known Allergies  Medications Prior to Admission  Medication Sig Dispense Refill Last Dose/Taking   Prenatal 28-0.8 MG TABS Take 1 tablet by mouth daily. 30 tablet 12 03/14/2024    Review of Systems  Gastrointestinal:  Negative for abdominal pain, nausea and vomiting.  Genitourinary:  Positive for vaginal bleeding. Negative for vaginal discharge and vaginal pain.   Physical Exam   Blood pressure (!) 110/58, pulse 79, temperature 98.3 F (36.8 C), temperature source Oral, resp. rate 16, height 5' 3 (1.6 m), weight 51.1 kg, last menstrual period 01/24/2024, SpO2 98%.  Physical Exam Vitals and nursing note reviewed.  Constitutional:      Appearance: Normal appearance.  HENT:     Head: Normocephalic and atraumatic.  Nose: No congestion or rhinorrhea.   Eyes:     Extraocular Movements: Extraocular movements intact.    Cardiovascular:     Rate and Rhythm: Normal rate.  Pulmonary:     Effort: Pulmonary effort is normal.  Abdominal:     Palpations: Abdomen is soft.     Tenderness: There is no abdominal tenderness.   Musculoskeletal:        General: Normal range of motion.     Cervical back: Normal range of motion.   Skin:    General: Skin is warm.     Capillary Refill: Capillary refill takes less than 2 seconds.   Neurological:     General: No focal deficit present.     Mental Status: She is alert.     Cranial Nerves: No cranial nerve deficit.   Psychiatric:        Mood and Affect: Mood normal.        Behavior: Behavior normal.     MAU Course  Procedures  MDM CBC Ultrasound hCG  quant Wet prep GC/chlamydia   Assessment and Plan  Shannon Lewis is a 28 yo G3P1011 @ [redacted]w[redacted]d presenting for vaginal bleeding in early pregnancy.  Vaginal bleeding in early pregnancy Patient with 2-day history of vaginal bleeding with occasional small clots.  Reports that LMP 7 weeks and 1 day ago but it was early for her normal menses.  hCG 16,099.  CBC within normal limits.  Wet prep showing bacterial vaginosis.  Ultrasound showing gestational sac with yolk sac but no fetal pole or cardiac activity.  Mean sac diameter 5 weeks and 6 days.  Patient will need viability ultrasound in 14 days.  Message sent to patient's clinic to have it scheduled.  BV treated with metronidazole.  No further questions or concerns.  Patient discharged home.  Emad Brechtel V Zayden Hahne 03/14/2024, 3:48 PM

## 2024-03-14 NOTE — Progress Notes (Signed)
 Pt left prior to completion of evaluation.  DR. Ilean called pt via phone because she was no longer in lobby.  Pt reports she left hospital.

## 2024-03-14 NOTE — Discharge Instructions (Signed)
 It was great seeing you today.  I am sorry you are having this bleeding.  Your vaginal swab did show you have bacterial vaginosis and I have sent a prescription for Flagyl to your pharmacy.  The ultrasound showed a gestational sac which means there is an baby forming in your uterus but she need a follow-up ultrasound in 14 days.  I sent a message to family to have this scheduled but if you do not hear anything by the beginning of next week please call them and find out.  If your symptoms worsen or you have increasing bleeding please return for further evaluation.  Hope you have a wonderful afternoon!

## 2024-03-15 LAB — GC/CHLAMYDIA PROBE AMP (~~LOC~~) NOT AT ARMC
Chlamydia: NEGATIVE
Comment: NEGATIVE
Comment: NORMAL
Neisseria Gonorrhea: NEGATIVE

## 2024-04-03 ENCOUNTER — Other Ambulatory Visit (INDEPENDENT_AMBULATORY_CARE_PROVIDER_SITE_OTHER): Payer: Self-pay

## 2024-04-03 ENCOUNTER — Ambulatory Visit: Admitting: *Deleted

## 2024-04-03 VITALS — BP 97/61 | HR 75 | Wt 112.5 lb

## 2024-04-03 DIAGNOSIS — Z3A08 8 weeks gestation of pregnancy: Secondary | ICD-10-CM | POA: Diagnosis not present

## 2024-04-03 DIAGNOSIS — Z3481 Encounter for supervision of other normal pregnancy, first trimester: Secondary | ICD-10-CM

## 2024-04-03 DIAGNOSIS — O3680X0 Pregnancy with inconclusive fetal viability, not applicable or unspecified: Secondary | ICD-10-CM

## 2024-04-03 DIAGNOSIS — Z1331 Encounter for screening for depression: Secondary | ICD-10-CM

## 2024-04-03 DIAGNOSIS — Z3480 Encounter for supervision of other normal pregnancy, unspecified trimester: Secondary | ICD-10-CM | POA: Insufficient documentation

## 2024-04-03 NOTE — Assessment & Plan Note (Signed)
 Problem created in Error/ Disregard

## 2024-04-03 NOTE — Progress Notes (Signed)
 New OB Intake  I connected with Meghana R Weidemann  on 04/03/24 at  1:10 PM EDT by In Person Visit and verified that I am speaking with the correct person using two identifiers. Nurse is located at [facility] and pt is located at [place].  I discussed the limitations, risks, security and privacy concerns of performing an evaluation and management service by telephone and the availability of in person appointments. I also discussed with the patient that there may be a patient responsible charge related to this service. The patient expressed understanding and agreed to proceed.  I explained I am completing New OB Intake today. We discussed EDD of 11/11/24 based on US  at [redacted]w[redacted]d weeks. Pt is G3P1011. I reviewed her allergies, medications and Medical/Surgical/OB history.    Patient Active Problem List   Diagnosis Date Noted   Nasal fracture 03/02/2020   Mandible fracture (HCC) 03/02/2020   Indication for care in labor or delivery 06/22/2019   UTI (urinary tract infection) in pregnancy, antepartum 01/01/2019     Concerns addressed today  Delivery Plans Plans to deliver at [facility]. Discussed the nature of our practice with multiple providers including residents and students. Due to the size of the practice, the delivering provider may not be the same as those providing prenatal care.   Patient is not interested in water birth.  MyChart/Babyscripts MyChart access verified. I explained pt will have some visits in office and some virtually. Babyscripts instructions given and order placed. Patient verifies receipt of registration text/e-mail. Account successfully created and app downloaded. If patient is a candidate for Optimized scheduling, add to sticky note.   Blood Pressure Cuff/Weight Scale Pt has BP cuff at home. Explained after first prenatal appt pt will check weekly and document in Babyscripts. Patient does not have weight scale; patient may purchase if they desire to track weight weekly in  Babyscripts.  Anatomy US  Explained first scheduled US  will be around 19 weeks. Anatomy US  scheduled for TBD at TBD.  Is patient a candidate for Babyscripts Optimization? Yes, patient accepted    First visit review I reviewed new OB appt with patient. Explained pt will be seen by Dr. Erik at first visit. Discussed [place] genetic screening with patient. Requests Panorama. Routine prenatal labs OB Urine only collected at today's visit. Initial OB labs deferred to New OB appt.   Last Pap Diagnosis  Date Value Ref Range Status  12/11/2018   Final   NEGATIVE FOR INTRAEPITHELIAL LESIONS OR MALIGNANCY.  12/11/2018   Final   FUNGAL ORGANISMS PRESENT CONSISTENT WITH CANDIDA SPP.    Rocky CHRISTELLA Ober, RN 04/03/2024  1:43 PM

## 2024-04-03 NOTE — Patient Instructions (Signed)

## 2024-04-06 LAB — CULTURE, OB URINE

## 2024-04-06 LAB — URINE CULTURE, OB REFLEX

## 2024-04-09 ENCOUNTER — Ambulatory Visit: Payer: Self-pay | Admitting: Obstetrics and Gynecology

## 2024-04-09 ENCOUNTER — Other Ambulatory Visit: Payer: Self-pay | Admitting: Obstetrics and Gynecology

## 2024-04-09 DIAGNOSIS — O2341 Unspecified infection of urinary tract in pregnancy, first trimester: Secondary | ICD-10-CM

## 2024-04-09 MED ORDER — CEFADROXIL 500 MG PO CAPS: 500.0000 mg | ORAL_CAPSULE | Freq: Two times a day (BID) | ORAL | 0 refills | Status: DC

## 2024-04-09 NOTE — Progress Notes (Signed)
 Rx sent for uti

## 2024-04-15 ENCOUNTER — Ambulatory Visit: Admitting: Obstetrics and Gynecology

## 2024-04-15 ENCOUNTER — Other Ambulatory Visit (HOSPITAL_COMMUNITY)
Admission: RE | Admit: 2024-04-15 | Discharge: 2024-04-15 | Disposition: A | Discharge status: Home / Self Care | Source: Ambulatory Visit | Attending: Obstetrics and Gynecology | Admitting: Obstetrics and Gynecology

## 2024-04-15 VITALS — BP 97/60 | HR 68 | Wt 124.0 lb

## 2024-04-15 DIAGNOSIS — Z3A10 10 weeks gestation of pregnancy: Secondary | ICD-10-CM

## 2024-04-15 DIAGNOSIS — R8271 Bacteriuria: Secondary | ICD-10-CM | POA: Diagnosis not present

## 2024-04-15 DIAGNOSIS — Z1331 Encounter for screening for depression: Secondary | ICD-10-CM

## 2024-04-15 DIAGNOSIS — Z3481 Encounter for supervision of other normal pregnancy, first trimester: Secondary | ICD-10-CM | POA: Diagnosis not present

## 2024-04-15 DIAGNOSIS — Z720 Tobacco use: Secondary | ICD-10-CM

## 2024-04-15 DIAGNOSIS — Z3480 Encounter for supervision of other normal pregnancy, unspecified trimester: Secondary | ICD-10-CM

## 2024-04-15 NOTE — Progress Notes (Signed)
 NOB  PHQ-9=11 Referral placed

## 2024-04-15 NOTE — Progress Notes (Signed)
 Subjective:   Shannon Lewis is a 28 y.o. G3P1011 at 101w0d by 8wk US  being seen today for her first obstetrical visit.  Her obstetrical history is significant for nicotine use. Patient does intend to breast feed. Pregnancy history fully reviewed. G1 TAB, G2 uncomplicated SVD 3520g   Patient reports no complaints.  HISTORY: OB History  Gravida Para Term Preterm AB Living  3 1 1  0 1 1  SAB IAB Ectopic Multiple Live Births  0 1 0 0 1    # Outcome Date GA Lbr Len/2nd Weight Sex Type Anes PTL Lv  3 Current           2 Term 06/23/19 [redacted]w[redacted]d 24:11 / 00:32 7 lb 12.2 oz (3.52 kg) F Vag-Spont EPI  LIV     Name: Specht,GIRL Rachele     Apgar1: 8  Apgar5: 9  1 IAB 2017             Last pap smear: Lab Results  Component Value Date   DIAGPAP  12/11/2018    NEGATIVE FOR INTRAEPITHELIAL LESIONS OR MALIGNANCY.   DIAGPAP  12/11/2018    FUNGAL ORGANISMS PRESENT CONSISTENT WITH CANDIDA SPP.    Past Medical History:  Diagnosis Date   Anemia    Infection    UTI   MRSA (methicillin resistant Staphylococcus aureus) infection 2015   Past Surgical History:  Procedure Laterality Date   CLOSED REDUCTION NASAL FRACTURE N/A 03/05/2020   Procedure: CLOSED REDUCTION NASAL FRACTURE;  Surgeon: Lowery Estefana RAMAN, DO;  Location: [facility];  Service: Plastics;  Laterality: N/A;   MANDIBULAR HARDWARE REMOVAL N/A 04/13/2020   Procedure: MAXILLOMANDIBULAR HARDWARE REMOVAL;  Surgeon: Lowery Estefana RAMAN, DO;  Location: [facility];  Service: Plastics;  Laterality: N/A;  30 min, please   ORIF MANDIBULAR FRACTURE N/A 03/05/2020   Procedure: OPEN REDUCTION INTERNAL FIXATION (ORIF) MANDIBULAR FRACTURE;  Surgeon: Lowery Estefana RAMAN, DO;  Location: [facility];  Service: Plastics;  Laterality: N/A;   THERAPEUTIC ABORTION     Family History  Problem Relation Age of Onset   COPD Mother    Asthma Mother    Asthma Father    COPD Father    Hypertension  Father    Social History   Tobacco Use   Smoking status: Former    Current packs/day: 0.25    Average packs/day: 0.3 packs/day for 7.0 years (1.8 ttl pk-yrs)    Types: Cigarettes   Smokeless tobacco: Never   Tobacco comments:    quit in March  Vaping Use   Vaping status: Some Days  Substance Use Topics   Alcohol use: Not Currently    Comment: occasional   Drug use: Never   No Known Allergies Current Outpatient Medications on File Prior to Visit  Medication Sig Dispense Refill   cefadroxil  (DURICEF) 500 MG capsule Take 1 capsule (500 mg total) by mouth 2 (two) times daily. 14 capsule 0   Prenatal Vit-Fe Fumarate-FA (MULTIVITAMIN-PRENATAL) 27-0.8 MG TABS tablet Take 1 tablet by mouth daily.     Prenatal 28-0.8 MG TABS Take 1 tablet by mouth daily. 30 tablet 12   No current facility-administered medications on file prior to visit.    Exam   Vitals:   04/15/24 1528  BP: 97/60  Pulse: 68  Weight: 124 lb (56.2 kg)     FHR 150s on bedside ultrasound General:  Alert, oriented and cooperative. Patient is in no acute distress.  Breast:  deferred  Cardiovascular: Normal heart rate noted  Respiratory: Normal respiratory effort, no problems with respiration noted  Abdomen: Soft, non tender   Assessment:   Pregnancy: G3P1011 Patient Active Problem List   Diagnosis Date Noted   Asymptomatic bacteriuria 04/15/2024   Vapes nicotine containing substance 04/15/2024   Supervision of other normal pregnancy, antepartum 04/03/2024   Plan:  Supervision of other normal pregnancy, antepartum [redacted] weeks gestation of pregnancy Initial labs drawn. Continue prenatal vitamins. Genetic Screening discussed: NIPS ordered, carrier screening previously completed and negative Ultrasound discussed; fetal anatomic survey: ordered. Problem list reviewed and updated. The nature of [facility] Practice with multiple MDs and other Advanced Practice Providers was explained  to patient; also emphasized that residents, students are part of our team. Routine obstetric precautions reviewed. -     Cytology - PAP( [place]) -     Cervicovaginal ancillary only( [place]) -     CBC/D/Plt+RPR+Rh+ABO+RubIgG... 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  Kieth Carolin, MD Obstetrician & Gynecologist, [facility] for [employer], [facility]

## 2024-04-16 ENCOUNTER — Ambulatory Visit: Payer: Self-pay | Admitting: Obstetrics and Gynecology

## 2024-04-16 DIAGNOSIS — Z3480 Encounter for supervision of other normal pregnancy, unspecified trimester: Secondary | ICD-10-CM

## 2024-04-16 LAB — CBC/D/PLT+RPR+RH+ABO+RUBIGG...
Antibody Screen: NEGATIVE
Basophils Absolute: 0.1 x10E3/uL (ref 0.0–0.2)
Basos: 1 %
EOS (ABSOLUTE): 0.1 x10E3/uL (ref 0.0–0.4)
Eos: 1 %
HCV Ab: NONREACTIVE
HIV Screen 4th Generation wRfx: NONREACTIVE
Hematocrit: 34.8 % (ref 34.0–46.6)
Hemoglobin: 11.5 g/dL (ref 11.1–15.9)
Hepatitis B Surface Ag: NEGATIVE
Immature Grans (Abs): 0 x10E3/uL (ref 0.0–0.1)
Immature Granulocytes: 0 %
Lymphocytes Absolute: 2.7 x10E3/uL (ref 0.7–3.1)
Lymphs: 22 %
MCH: 31.7 pg (ref 26.6–33.0)
MCHC: 33 g/dL (ref 31.5–35.7)
MCV: 96 fL (ref 79–97)
Monocytes Absolute: 0.8 x10E3/uL (ref 0.1–0.9)
Monocytes: 6 %
Neutrophils Absolute: 8.4 x10E3/uL — ABNORMAL HIGH (ref 1.4–7.0)
Neutrophils: 70 %
Platelets: 192 x10E3/uL (ref 150–450)
RBC: 3.63 x10E6/uL — ABNORMAL LOW (ref 3.77–5.28)
RDW: 12.8 % (ref 11.7–15.4)
RPR Ser Ql: NONREACTIVE
Rh Factor: POSITIVE
Rubella Antibodies, IGG: 1 {index} (ref >0.99)
WBC: 12 x10E3/uL — ABNORMAL HIGH (ref 3.4–10.8)

## 2024-04-16 LAB — CERVICOVAGINAL ANCILLARY ONLY
Chlamydia: NEGATIVE
Comment: NEGATIVE
Comment: NORMAL
Neisseria Gonorrhea: NEGATIVE

## 2024-04-16 LAB — HEMOGLOBIN A1C
Est. average glucose Bld gHb Est-mCnc: 103 mg/dL
Hgb A1c MFr Bld: 5.2 % (ref 4.8–5.6)

## 2024-04-16 LAB — HCV INTERPRETATION

## 2024-04-21 LAB — PANORAMA PRENATAL TEST FULL PANEL:PANORAMA TEST PLUS 5 ADDITIONAL MICRODELETIONS: FETAL FRACTION: 15.1

## 2024-04-21 LAB — CYTOLOGY - PAP: Diagnosis: NEGATIVE

## 2024-05-13 ENCOUNTER — Ambulatory Visit: Payer: Self-pay | Admitting: Obstetrics and Gynecology

## 2024-05-13 ENCOUNTER — Encounter: Payer: Self-pay | Admitting: Obstetrics and Gynecology

## 2024-05-13 VITALS — BP 114/60 | HR 80 | Wt 117.2 lb

## 2024-05-13 DIAGNOSIS — Z3A14 14 weeks gestation of pregnancy: Secondary | ICD-10-CM | POA: Diagnosis not present

## 2024-05-13 DIAGNOSIS — Z3480 Encounter for supervision of other normal pregnancy, unspecified trimester: Secondary | ICD-10-CM | POA: Diagnosis not present

## 2024-05-13 DIAGNOSIS — R8271 Bacteriuria: Secondary | ICD-10-CM

## 2024-05-13 NOTE — Progress Notes (Signed)
 ROB. No concerns at this time.

## 2024-05-13 NOTE — Progress Notes (Signed)
   PRENATAL VISIT NOTE  Subjective:  Shannon Lewis is a 28 y.o. G3P1011 at [redacted]w[redacted]d being seen today for ongoing prenatal care.  She is currently monitored for the following issues for this low-risk pregnancy and has Supervision of other normal pregnancy, antepartum; Asymptomatic bacteriuria; and Vapes nicotine containing substance on their problem list.  Patient reports no complaints.  Contractions: Irritability. Vag. Bleeding: None.  Movement: Present. Denies leaking of fluid.   The following portions of the patient's history were reviewed and updated as appropriate: allergies, current medications, past family history, past medical history, past social history, past surgical history and problem list.   Objective:    Vitals:   05/13/24 1458  BP: 114/60  Pulse: 80  Weight: 117 lb 3.2 oz (53.2 kg)    Fetal Status:  Fetal Heart Rate (bpm): 150   Movement: Present    General: Alert, oriented and cooperative. Patient is in no acute distress.  Skin: Skin is warm and dry. No rash noted.   Cardiovascular: Normal heart rate noted  Respiratory: Normal respiratory effort, no problems with respiration noted  Abdomen: Soft, gravid, appropriate for gestational age.  Pain/Pressure: Present     Pelvic: Cervical exam deferred        Extremities: Normal range of motion.  Edema: None  Mental Status: Normal mood and affect. Normal behavior. Normal judgment and thought content.   Assessment and Plan:  Pregnancy: G3P1011 at [redacted]w[redacted]d 1. Supervision of other normal pregnancy, antepartum (Primary) BP and FHR normal  2. [redacted] weeks gestation of pregnancy Not scheduled for anatomy scan yet, schedule today  Declined AFP toda y  3. Asymptomatic bacteriuria during pregnancy Completed abx, toc today  - Urine Culture  Preterm labor symptoms and general obstetric precautions including but not limited to vaginal bleeding, contractions, leaking of fluid and fetal movement were reviewed in detail with the  patient. Please refer to After Visit Summary for other counseling recommendations.   Return in about 4 weeks (around 06/10/2024), or schedule MFM anatomy scan, for OB VISIT (MD or APP).    Nidia Daring, FNP

## 2024-05-15 ENCOUNTER — Ambulatory Visit: Payer: Self-pay | Admitting: Obstetrics and Gynecology

## 2024-05-15 LAB — URINE CULTURE

## 2024-06-10 ENCOUNTER — Encounter: Payer: Self-pay | Admitting: Obstetrics and Gynecology

## 2024-06-10 ENCOUNTER — Ambulatory Visit (INDEPENDENT_AMBULATORY_CARE_PROVIDER_SITE_OTHER): Admitting: Obstetrics and Gynecology

## 2024-06-10 VITALS — BP 94/55 | HR 91 | Wt 120.0 lb

## 2024-06-10 DIAGNOSIS — Z3A18 18 weeks gestation of pregnancy: Secondary | ICD-10-CM

## 2024-06-10 DIAGNOSIS — Z3480 Encounter for supervision of other normal pregnancy, unspecified trimester: Secondary | ICD-10-CM | POA: Diagnosis not present

## 2024-06-10 MED ORDER — ONDANSETRON 4 MG PO TBDP: 4.0000 mg | ORAL_TABLET | Freq: Four times a day (QID) | ORAL | 0 refills | Status: AC | PRN

## 2024-06-10 NOTE — Progress Notes (Signed)
 Pt states she is still having some occ N&V.

## 2024-06-10 NOTE — Progress Notes (Signed)
   PRENATAL VISIT NOTE  Subjective:  Shannon Lewis is a 28 y.o. G3P1011 at [redacted]w[redacted]d being seen today for ongoing prenatal care.  She is currently monitored for the following issues for this low-risk pregnancy and has Supervision of other normal pregnancy, antepartum; Asymptomatic bacteriuria; and Vapes nicotine containing substance on their problem list.  Patient reports persistent nausea.  Contractions: Not present. Vag. Bleeding: None.  Movement: Present. Denies leaking of fluid.   The following portions of the patient's history were reviewed and updated as appropriate: allergies, current medications, past family history, past medical history, past social history, past surgical history and problem list.   Objective:    Vitals:   06/10/24 1004  BP: (!) 94/55  Pulse: 91  Weight: 120 lb (54.4 kg)    Fetal Status:  Fetal Heart Rate (bpm): 160   Movement: Present    General: Alert, oriented and cooperative. Patient is in no acute distress.  Skin: Skin is warm and dry. No rash noted.   Cardiovascular: Normal heart rate noted  Respiratory: Normal respiratory effort, no problems with respiration noted  Abdomen: Soft, gravid, appropriate for gestational age.  Pain/Pressure: Absent     Pelvic: Cervical exam deferred        Extremities: Normal range of motion.     Mental Status: Normal mood and affect. Normal behavior. Normal judgment and thought content.   Assessment and Plan:  Pregnancy: G3P1011 at [redacted]w[redacted]d 1. Supervision of other normal pregnancy, antepartum (Primary) Patient is doing well  Rx- zofran  provided AFP today Anatomy ultrasound scheduled on 10/6  2. [redacted] weeks gestation of pregnancy   Preterm labor symptoms and general obstetric precautions including but not limited to vaginal bleeding, contractions, leaking of fluid and fetal movement were reviewed in detail with the patient. Please refer to After Visit Summary for other counseling recommendations.   Return in about 4  weeks (around 07/08/2024) for in person, ROB, Low risk.  Future Appointments  Date Time Provider Department Center  06/10/2024 10:15 AM Wateen Varon, [place], MD [facility] None  06/24/2024  8:00 AM [facility] PROVIDER 1 [facility]  06/24/2024  8:30 AM [facility] US1 [facility]    Winton Felt, MD

## 2024-06-24 ENCOUNTER — Ambulatory Visit

## 2024-06-24 ENCOUNTER — Other Ambulatory Visit: Payer: Self-pay | Admitting: Obstetrics and Gynecology

## 2024-06-24 ENCOUNTER — Ambulatory Visit: Attending: Maternal & Fetal Medicine | Admitting: Maternal & Fetal Medicine

## 2024-06-24 ENCOUNTER — Other Ambulatory Visit: Payer: Self-pay | Admitting: *Deleted

## 2024-06-24 VITALS — BP 111/57 | HR 102

## 2024-06-24 DIAGNOSIS — Z3480 Encounter for supervision of other normal pregnancy, unspecified trimester: Secondary | ICD-10-CM

## 2024-06-24 DIAGNOSIS — Z3A20 20 weeks gestation of pregnancy: Secondary | ICD-10-CM | POA: Diagnosis not present

## 2024-06-24 DIAGNOSIS — O43192 Other malformation of placenta, second trimester: Secondary | ICD-10-CM

## 2024-06-24 DIAGNOSIS — Z87891 Personal history of nicotine dependence: Secondary | ICD-10-CM

## 2024-06-24 DIAGNOSIS — O3680X0 Pregnancy with inconclusive fetal viability, not applicable or unspecified: Secondary | ICD-10-CM

## 2024-06-24 DIAGNOSIS — O99332 Smoking (tobacco) complicating pregnancy, second trimester: Secondary | ICD-10-CM | POA: Insufficient documentation

## 2024-06-24 DIAGNOSIS — Z363 Encounter for antenatal screening for malformations: Secondary | ICD-10-CM | POA: Insufficient documentation

## 2024-06-24 NOTE — Progress Notes (Signed)
 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 G3P1011 at [redacted]w[redacted]d here for ultrasound and consultation. She had low risk aneuploidy screening of a female fetus. Carrier screening was Negative for the basic screening (SMA, alpha-thal, beta-thal, and cystic fibroisis. Maternal serum AFP was negative. She has no acute concerns.   Today we focused on the following:   History of tobacco use: The patient was smoking tobacco products prior to pregnancy.  We discussed the importance of tobacco cessation during pregnancy including electronic nicotine devices as well as secondhand smoke exposure.  Marginal cord insertion Marginal cord insertion was seen on today's ultrasound. There are no other evident fetal or placental abnormalities and the placenta is well away from the internal os.  I discussed the diagnosis, management and prognosis of pregnancy associated with the marginal umbilical cord insertion in pregnancy.  Normally the umbilical cord inserts centrally into the placenta, however, with a marginal cord insertion the umbilical cord inserts within less than 2 cm from the placental edge.  This is also known as a battledore placenta.  It occurs in about 6% of singleton pregnancies but is as high as 10 to 15% in twin pregnancies. Marginal cord insertion is even more common in monochorionic twins, and it has been associated with an increased risk for an SGA newborn in monochorionic but not dichorionic twin pregnancies. In  singleton pregnancies, there are associations with adverse centric outcomes such as  placental abruption (odds ratio 1.5), placenta previa (odds ratio 1.8), and small-for-gestational age (SGA) neonate (odds ratio 1.2), but not perinatal deaths. I explained that because of the potential for fetal growth restriction, it is recommended that serial growth ultrasounds be performed during the pregnancy.  Sonographic findings Single intrauterine pregnancy at 20w 0d. Fetal cardiac activity:  Observed and appears normal. Presentation: Cephalic. The anatomic structures that were well seen appear normal without evidence of soft markers. The anatomic survey is complete.  Fetal biometry shows the estimated fetal weight at the 87 percentile. Amniotic fluid: Within normal limits.  MVP: 4.34 cm. Placenta: Posterior Fundal. Adnexa: No abnormality visualized. Cervical length: 3.5 cm.  There are limitations of prenatal ultrasound such as the inability to detect certain abnormalities due to poor visualization. Various factors such as fetal position, gestational age and maternal body habitus may increase the difficulty in visualizing the fetal anatomy.    Recommendations -EDD should be 11/11/2024 based on  Early Ultrasound  (04/03/24). Serial growth US  starting around 28 weeks.   Review of Systems: A review of systems was performed and was negative except per HPI   Past Obstetrical History:  OB History  Gravida Para Term Preterm AB Living  3 1 1  0 1 1  SAB IAB Ectopic Multiple Live Births  0 1 0 0 1    # Outcome Date GA Lbr Len/2nd Weight Sex Type Anes PTL Lv  3 Current           2 Term 06/23/19 [redacted]w[redacted]d 24:11 / 00:32 7 lb 12.2 oz (3.52 kg) F Vag-Spont EPI  LIV  1 IAB 2017             Past Medical History:  Past Medical History:  Diagnosis Date   Anemia    Infection    UTI   MRSA (methicillin resistant Staphylococcus aureus) infection 2015     Past Surgical History:    Past Surgical History:   Procedure Laterality Date   CLOSED REDUCTION NASAL FRACTURE N/A 03/05/2020   Procedure: CLOSED REDUCTION NASAL FRACTURE;  Surgeon: Lowery Estefana RAMAN, DO;  Location: [facility];  Service: Plastics;  Laterality: N/A;   MANDIBULAR HARDWARE REMOVAL N/A 04/13/2020   Procedure: MAXILLOMANDIBULAR HARDWARE REMOVAL;  Surgeon: Lowery Estefana RAMAN, DO;  Location: [facility];  Service: Plastics;  Laterality: N/A;  30 min, please   ORIF MANDIBULAR FRACTURE N/A 03/05/2020   Procedure: OPEN REDUCTION INTERNAL FIXATION (ORIF) MANDIBULAR FRACTURE;  Surgeon: Lowery Estefana RAMAN, DO;  Location: [facility];  Service: Plastics;  Laterality: N/A;   THERAPEUTIC ABORTION       Home Medications:   Current Outpatient Medications on File Prior to Visit  Medication Sig Dispense Refill   ondansetron  (ZOFRAN -ODT) 4 MG disintegrating tablet Take 1 tablet (4 mg total) by mouth every 6 (six) hours as needed for nausea. 20 tablet 0   Prenatal Vit-Fe Fumarate-FA (MULTIVITAMIN-PRENATAL) 27-0.8 MG TABS tablet Take 1 tablet by mouth daily.     Prenatal 28-0.8 MG TABS Take 1 tablet by mouth daily. (Patient not taking: Reported on 05/13/2024) 30 tablet 12   No current facility-administered medications on file prior to visit.      Allergies:   No Known Allergies   Physical Exam:   Vitals:   06/24/24 0808  BP: (!) 111/57  Pulse: (!) 102   Sitting comfortably on the sonogram table Nonlabored breathing Normal rate and rhythm Abdomen is nontender  Thank you for the opportunity to be involved with this patient's care. Please let us  know if we can be of any further assistance.   45 minutes of time was spent reviewing the patient's chart including labs, imaging and documentation.  At least 50% of this time was spent with direct patient care discussing the diagnosis, management and prognosis of her care.  Delora Smaller MFM, [facility]   06/24/2024  9:34 AM

## 2024-07-08 ENCOUNTER — Encounter: Payer: Self-pay | Admitting: Obstetrics and Gynecology

## 2024-07-08 ENCOUNTER — Ambulatory Visit (INDEPENDENT_AMBULATORY_CARE_PROVIDER_SITE_OTHER): Admitting: Obstetrics and Gynecology

## 2024-07-08 VITALS — BP 106/59 | HR 75 | Wt 127.8 lb

## 2024-07-08 DIAGNOSIS — Z3A22 22 weeks gestation of pregnancy: Secondary | ICD-10-CM

## 2024-07-08 DIAGNOSIS — O43192 Other malformation of placenta, second trimester: Secondary | ICD-10-CM | POA: Diagnosis not present

## 2024-07-08 DIAGNOSIS — Z3480 Encounter for supervision of other normal pregnancy, unspecified trimester: Secondary | ICD-10-CM

## 2024-07-08 NOTE — Progress Notes (Signed)
 Pt presents for ROB visit. No concerns

## 2024-07-08 NOTE — Progress Notes (Signed)
   PRENATAL VISIT NOTE  Subjective:  Shannon Lewis is a 28 y.o. G3P1011 at [redacted]w[redacted]d being seen today for ongoing prenatal care.  She is currently monitored for the following issues for this low-risk pregnancy and has Supervision of other normal pregnancy, antepartum; Asymptomatic bacteriuria; Vapes nicotine containing substance; and Marginal insertion of umbilical cord affecting management of mother in second trimester on their problem list.  Patient reports no complaints.  Contractions: Not present. Vag. Bleeding: None.  Movement: Present. Denies leaking of fluid.   The following portions of the patient's history were reviewed and updated as appropriate: allergies, current medications, past family history, past medical history, past social history, past surgical history and problem list.   Objective:    Vitals:   07/08/24 0920  BP: (!) 106/59  Pulse: 75  Weight: 127 lb 12.8 oz (58 kg)    Fetal Status:  Fetal Heart Rate (bpm): 150 Fundal Height: 23 cm Movement: Present    General: Alert, oriented and cooperative. Patient is in no acute distress.  Skin: Skin is warm and dry. No rash noted.   Cardiovascular: Normal heart rate noted  Respiratory: Normal respiratory effort, no problems with respiration noted  Abdomen: Soft, gravid, appropriate for gestational age.  Pain/Pressure: Present     Pelvic: Cervical exam deferred        Extremities: Normal range of motion.  Edema: None  Mental Status: Normal mood and affect. Normal behavior. Normal judgment and thought content.   Assessment and Plan:  Pregnancy: G3P1011 at [redacted]w[redacted]d 1. Supervision of other normal pregnancy, antepartum (Primary) BP and FHR normal Doing well, overall, feeling movement now    2. [redacted] weeks gestation of pregnancy -Anticipatory guidance regarding GTT and labs next visit, discussed NPO status after midnight  -peds list given, needs to switch   3. Marginal insertion of umbilical cord affecting management of mother  in second trimester Plan serial growths at 28 weeks  Preterm labor symptoms and general obstetric precautions including but not limited to vaginal bleeding, contractions, leaking of fluid and fetal movement were reviewed in detail with the patient. Please refer to After Visit Summary for other counseling recommendations.   Return in about 4 weeks (around 08/05/2024) for OB VISIT (MD or APP), 2 hr GTT.  Future Appointments  Date Time Provider Department Center  08/20/2024  1:15 PM [facility] PROVIDER 1 [facility]  08/20/2024  1:30 PM [facility] US1 [facility]    Nidia Daring, FNP

## 2024-07-08 NOTE — Patient Instructions (Signed)
 [facility] Pediatric Providers  [place] ([id number])  [facility]) - [facility], MD; Manson Passey, MD; Ave Filter, MD; Luna Fuse, MD; Kennedy Bucker, MD; Florestine Avers, MD; Melchor Amour, MD; Yetta Barre,  MD; Konrad Dolores, MD; Kathlene November, MD; Jenne Campus, MD; Wynetta Emery, MD; Duffy Rhody, MD; Gerre Couch, NP [address] [place], [place], [place] [address] [phone] Mon, Tue, Thur, Fri 8:30-5:30, Wed 9:30-5:30, Sat 8:30-12:30 Only accepting infants of first-time parents or siblings of current patients Hospital discharge coordinator will make follow-up appointment Medicaid - yes; Tricare - yes   [facility] - Pediatrics at [place]e Radon, MD; Sabino Dick, MD; Quitman Livings, MD; Betha Loa, NP; Claretha Cooper, MD; Lelon Perla, MD [address] [place] [place], [place] [address] [phone] Mon-Fri 8:30-5:30 Medicaid - yes, Tricare - yes  [place] [phone]) [facility], MD [address], [place], [place] [address] [phone] Mon, Tues, Wed Fri 8:30-5:00, Sat 8:30-12:00, Closed Thursdays Accepting siblings of established patients and first time mom's if you call prenatally Medicaid- yes; Tricare - yes   [facility] [address], [place], [place] [address] [phone] Mon-Fri 8:30-5:00 (lunch 12:00-1:00) Medicaid -Yes; Tricare - Yes   [facility] [place], MD; [place], [place]; [place], [place]; [place], [place] [address] [place] [address] [phone] Mon-Fri 7:30-5:30 Medicaid - Yes; Sherolyn Buba  [place] [phone] & [phone])  [facility], MD [address] [place], [place] [address] [phone] Mon-Thur 8:00-6:00, closed for lunch 12-2, closed Fridays Medicaid - yes; Tricare - no  [facility] Dareen Piano, NP; Cyndia Bent, MD; [place], [place]; [place], [place] [address], [place],  [place] [address] [phone] Mon-Fri 7:30-4:30 Medicaid - yes, Tricare - yes  [facility]  Juanito Doom, MD; Janene Harvey, NP; Vonita Moss, MD; Donn Pierini, NP [address], [place], [place] [address] [phone] Mon-Fri 8:30-5:00, closed for lunch 1-2, Sat 8:30-12:00 - sick visits only Providers come to see babies at [facility] Only accepting newborns of siblings and first time parents ONLY if who have met with office prior to delivery Medicaid -Yes; Tricare - yes  [facility] - [place], [place]; Spero Geralds, NP; Earlene Plater, MD; Lucretia Roers, MD:  [address], [place], [place] [address] [phone] Mon- Fri 8:00-5:00, Sat 9:00-12:00 - sick visits only Accepting siblings of established patients and first time mom/baby Medicaid - Yes; Tricare - yes Patients must have vaccinations (baby vaccines)   [employer] [phone])  [facility] Alfredo Bach, PA; [place], [place]; Eddie Candle, MD; Normand Sloop, MD; [place], NP; [place], DO; [place], [place]; Constance Goltz, MD; Ruthann Cancer, MD; Vear Clock, MD; [place], [place]; [place], [place]; [place], [place] [address] [facility] [address], [place], [place] [address] [phone] Mon-Fri 8:30-5:00, Sat 9:00-12:00 - sick only Please register online [email or web address] then schedule online or call office Medicaid-Yes; Tricare -yes   [facility] - [facility] at [place] ([facility] [place]) [place], [place]; List, FNP; Berneda Rose, MD; Luther Redo, PA-C; Lavonia Drafts, MD; Kellie Simmering, FNP; Genevie Cheshire, FNP; Evaristo Bury, MD; Berneda Rose, MD [phone] [address] [place], [place] [address] [phone] Mon-Fri 8:30-5:30 Medicaid - Yes; Tricare - yes  [facility] - [address]  [place], [place]; Whitney Post, MD; Hennie Duos, MD; Wynne Dust, MD; [place], NP [address], [place], [place] [address] [phone] Mon-Thur 8:00-5:30, Fri 8:00-5:00, Sat 9:00-12:00 Medicaid - yes, Tricare - yes

## 2024-07-18 ENCOUNTER — Ambulatory Visit (INDEPENDENT_AMBULATORY_CARE_PROVIDER_SITE_OTHER)

## 2024-07-18 ENCOUNTER — Other Ambulatory Visit (HOSPITAL_COMMUNITY)
Admission: RE | Admit: 2024-07-18 | Discharge: 2024-07-18 | Disposition: A | Discharge status: Home / Self Care | Source: Ambulatory Visit | Attending: Obstetrics | Admitting: Obstetrics

## 2024-07-18 VITALS — BP 103/60 | HR 67

## 2024-07-18 DIAGNOSIS — Z202 Contact with and (suspected) exposure to infections with a predominantly sexual mode of transmission: Secondary | ICD-10-CM | POA: Insufficient documentation

## 2024-07-18 MED ORDER — CEFTRIAXONE SODIUM 500 MG IJ SOLR
500.0000 mg | Freq: Once | INTRAMUSCULAR | Status: AC
  Administered 2024-07-18: 500 mg via INTRAMUSCULAR

## 2024-07-18 NOTE — Progress Notes (Signed)
 SUBJECTIVE:  28 y.o. female who desires a STI screen. Denies abnormal vaginal discharge, bleeding or significant pelvic pain. No UTI symptoms. Exposure to STD from FOB who tested positive for Gonorrhea.   Patient's last menstrual period was 01/24/2024 (approximate).  OBJECTIVE:  She appears well.   ASSESSMENT:  STI Screen   PLAN:  Pt offered STI blood screening-not indicated GC, chlamydia, and trichomonas probe sent to lab.  Treatment: To be determined once lab results are received.  Pt follow up as needed.  Since pt does know that FOB has Gonorrhea pt was already treated in office due to this with 500mg  Rocephin injection IM in [facility]OQ.

## 2024-07-19 LAB — CERVICOVAGINAL ANCILLARY ONLY
Bacterial Vaginitis (gardnerella): NEGATIVE
Candida Glabrata: NEGATIVE
Candida Vaginitis: POSITIVE — AB
Chlamydia: NEGATIVE
Comment: NEGATIVE
Comment: NEGATIVE
Comment: NEGATIVE
Comment: NEGATIVE
Comment: NEGATIVE
Comment: NORMAL
Neisseria Gonorrhea: POSITIVE — AB
Trichomonas: NEGATIVE

## 2024-07-20 ENCOUNTER — Ambulatory Visit: Payer: Self-pay | Admitting: Obstetrics and Gynecology

## 2024-07-20 MED ORDER — CLOTRIMAZOLE 1 % VA CREA: 1.0000 | TOPICAL_CREAM | Freq: Every day | VAGINAL | 0 refills | Status: AC

## 2024-07-30 ENCOUNTER — Other Ambulatory Visit: Payer: Self-pay

## 2024-07-30 MED ORDER — TERCONAZOLE 0.8 % VA CREA: 1.0000 | TOPICAL_CREAM | Freq: Every day | VAGINAL | 0 refills | Status: DC

## 2024-08-05 ENCOUNTER — Ambulatory Visit (INDEPENDENT_AMBULATORY_CARE_PROVIDER_SITE_OTHER): Admitting: Obstetrics

## 2024-08-05 ENCOUNTER — Other Ambulatory Visit

## 2024-08-05 ENCOUNTER — Encounter: Payer: Self-pay | Admitting: Obstetrics

## 2024-08-05 VITALS — BP 102/62 | HR 83 | Wt 132.3 lb

## 2024-08-05 DIAGNOSIS — Z3482 Encounter for supervision of other normal pregnancy, second trimester: Secondary | ICD-10-CM

## 2024-08-05 DIAGNOSIS — R8271 Bacteriuria: Secondary | ICD-10-CM | POA: Diagnosis not present

## 2024-08-05 DIAGNOSIS — Z3A26 26 weeks gestation of pregnancy: Secondary | ICD-10-CM | POA: Diagnosis not present

## 2024-08-05 DIAGNOSIS — Z3480 Encounter for supervision of other normal pregnancy, unspecified trimester: Secondary | ICD-10-CM

## 2024-08-05 NOTE — Progress Notes (Signed)
 Subjective:  Shannon Lewis is a 28 y.o. G3P1011 at [redacted]w[redacted]d being seen today for ongoing prenatal care.  She is currently monitored for the following issues for this low-risk pregnancy and has Supervision of other normal pregnancy, antepartum; Asymptomatic bacteriuria; Vapes nicotine containing substance; and Marginal insertion of umbilical cord affecting management of mother in second trimester on their problem list.  Patient reports no complaints.  Contractions: Not present. Vag. Bleeding: None.  Movement: Present. Denies leaking of fluid.   The following portions of the patient's history were reviewed and updated as appropriate: allergies, current medications, past family history, past medical history, past social history, past surgical history and problem list. Problem list updated.  Objective:   Vitals:   08/05/24 0950  BP: 102/62  Pulse: 83  Weight: 132 lb 4.8 oz (60 kg)    Fetal Status: Fetal Heart Rate (bpm): 145   Movement: Present     General:  Alert, oriented and cooperative. Patient is in no acute distress.  Skin: Skin is warm and dry. No rash noted.   Cardiovascular: Normal heart rate noted  Respiratory: Normal respiratory effort, no problems with respiration noted  Abdomen: Soft, gravid, appropriate for gestational age. Pain/Pressure: Present     Pelvic:  Cervical exam deferred        Extremities: Normal range of motion.  Edema: None  Mental Status: Normal mood and affect. Normal behavior. Normal judgment and thought content.   Urinalysis:      Assessment and Plan:  Pregnancy: G3P1011 at [redacted]w[redacted]d  1. Supervision of other normal pregnancy, antepartum (Primary)  2. Asymptomatic bacteriuria, treated - treat in labor    Preterm labor symptoms and general obstetric precautions including but not limited to vaginal bleeding, contractions, leaking of fluid and fetal movement were reviewed in detail with the patient. Please refer to After Visit Summary for other counseling  recommendations.   Return in about 2 weeks (around 08/19/2024) for ROB, 2 hour OGTT.   Rudy Carlin LABOR, MD 08/05/2024

## 2024-08-05 NOTE — Progress Notes (Signed)
 Pt presents for rob. Pt has no questions or concerns at this time.

## 2024-08-06 ENCOUNTER — Other Ambulatory Visit

## 2024-08-06 DIAGNOSIS — Z3A26 26 weeks gestation of pregnancy: Secondary | ICD-10-CM

## 2024-08-07 ENCOUNTER — Ambulatory Visit: Payer: Self-pay | Admitting: Obstetrics and Gynecology

## 2024-08-07 DIAGNOSIS — O24419 Gestational diabetes mellitus in pregnancy, unspecified control: Secondary | ICD-10-CM | POA: Insufficient documentation

## 2024-08-07 DIAGNOSIS — D649 Anemia, unspecified: Secondary | ICD-10-CM | POA: Insufficient documentation

## 2024-08-07 LAB — CBC
Hematocrit: 30.3 % — ABNORMAL LOW (ref 34.0–46.6)
Hemoglobin: 10.1 g/dL — ABNORMAL LOW (ref 11.1–15.9)
MCH: 32.7 pg (ref 26.6–33.0)
MCHC: 33.3 g/dL (ref 31.5–35.7)
MCV: 98 fL — ABNORMAL HIGH (ref 79–97)
Platelets: 225 x10E3/uL (ref 150–450)
RBC: 3.09 x10E6/uL — ABNORMAL LOW (ref 3.77–5.28)
RDW: 11.9 % (ref 11.7–15.4)
WBC: 14.1 x10E3/uL — ABNORMAL HIGH (ref 3.4–10.8)

## 2024-08-07 LAB — GLUCOSE TOLERANCE, 2 HOURS W/ 1HR
Glucose, 1 hour: 160 mg/dL (ref 70–179)
Glucose, 2 hour: 71 mg/dL (ref 70–152)
Glucose, Fasting: 97 mg/dL — ABNORMAL HIGH (ref 70–91)

## 2024-08-07 LAB — HIV ANTIBODY (ROUTINE TESTING W REFLEX): HIV Screen 4th Generation wRfx: NONREACTIVE

## 2024-08-07 LAB — RPR: RPR Ser Ql: NONREACTIVE

## 2024-08-08 ENCOUNTER — Other Ambulatory Visit: Payer: Self-pay

## 2024-08-08 MED ORDER — ACCU-CHEK SOFTCLIX LANCETS MISC: 12 refills | Status: AC

## 2024-08-08 MED ORDER — ACCU-CHEK GUIDE W/DEVICE KIT: 1.0000 | PACK | Freq: Four times a day (QID) | 0 refills | Status: AC

## 2024-08-08 MED ORDER — ACCU-CHEK GUIDE TEST VI STRP: ORAL_STRIP | 12 refills | Status: AC

## 2024-08-19 ENCOUNTER — Ambulatory Visit: Admitting: Obstetrics and Gynecology

## 2024-08-19 ENCOUNTER — Encounter: Admitting: Obstetrics and Gynecology

## 2024-08-19 VITALS — BP 101/62 | HR 94 | Wt 135.0 lb

## 2024-08-19 DIAGNOSIS — Z23 Encounter for immunization: Secondary | ICD-10-CM | POA: Diagnosis not present

## 2024-08-19 DIAGNOSIS — O99012 Anemia complicating pregnancy, second trimester: Secondary | ICD-10-CM | POA: Diagnosis not present

## 2024-08-19 DIAGNOSIS — D649 Anemia, unspecified: Secondary | ICD-10-CM

## 2024-08-19 DIAGNOSIS — O43193 Other malformation of placenta, third trimester: Secondary | ICD-10-CM

## 2024-08-19 DIAGNOSIS — O43192 Other malformation of placenta, second trimester: Secondary | ICD-10-CM | POA: Diagnosis not present

## 2024-08-19 DIAGNOSIS — Z3480 Encounter for supervision of other normal pregnancy, unspecified trimester: Secondary | ICD-10-CM

## 2024-08-19 DIAGNOSIS — Z3A28 28 weeks gestation of pregnancy: Secondary | ICD-10-CM | POA: Diagnosis not present

## 2024-08-19 DIAGNOSIS — O24410 Gestational diabetes mellitus in pregnancy, diet controlled: Secondary | ICD-10-CM

## 2024-08-19 DIAGNOSIS — O24419 Gestational diabetes mellitus in pregnancy, unspecified control: Secondary | ICD-10-CM | POA: Diagnosis not present

## 2024-08-19 MED ORDER — PRENATAL 27-0.8 MG PO TABS: 1.0000 | ORAL_TABLET | Freq: Every day | ORAL | 12 refills | Status: AC

## 2024-08-19 NOTE — Addendum Note (Signed)
 Addended by: ELAINE ROSINA SAILOR on: 08/19/2024 03:51 PM   Modules accepted: Orders

## 2024-08-19 NOTE — Addendum Note (Signed)
 Addended by: Alejandria Wessells J on: 08/19/2024 03:33 PM   Modules accepted: Orders

## 2024-08-19 NOTE — Progress Notes (Signed)
   PRENATAL VISIT NOTE  Subjective:  Shannon Lewis is a 28 y.o. G3P1011 at [redacted]w[redacted]d being seen today for ongoing prenatal care.  She is currently monitored for the following issues for this high-risk pregnancy and has Supervision of other normal pregnancy, antepartum; Vapes nicotine containing substance; Marginal insertion of umbilical cord affecting management of mother in second trimester; GDM (gestational diabetes mellitus); and Anemia on their problem list.  Patient reports no complaints.  Contractions: Not present. Vag. Bleeding: None.  Movement: Present. Denies leaking of fluid.   The following portions of the patient's history were reviewed and updated as appropriate: allergies, current medications, past family history, past medical history, past social history, past surgical history and problem list.   Objective:   Vitals:   08/19/24 1456  BP: 101/62  Pulse: 94  Weight: 135 lb (61.2 kg)    Fetal Status: Fetal Heart Rate (bpm): 152   Movement: Present     General:  Alert, oriented and cooperative. Patient is in no acute distress.  Skin: Skin is warm and dry. No rash noted.   Cardiovascular: Normal heart rate noted  Respiratory: Normal respiratory effort, no problems with respiration noted  Abdomen: Soft, gravid, appropriate for gestational age.  Pain/Pressure: Present      Assessment and Plan:  Pregnancy: G3P1011 at [redacted]w[redacted]d 1. Supervision of other normal pregnancy, antepartum (Primary) 2. [redacted] weeks gestation of pregnancy Tdap today, considering flu - Prenatal Vit-Fe Fumarate-FA (MULTIVITAMIN-PRENATAL) 27-0.8 MG TABS tablet; Take 1 tablet by mouth daily.  Dispense: 30 tablet; Refill: 12  3. Gestational diabetes mellitus (GDM) in third trimester, gestational diabetes method of control unspecified - Reviewed diagnosis of GDM - Discussed the risks associated in pregnancy especially with poor control including but not limited to increased risk of preeclampsia, macrosomia, need for  operative delivery I.e. vacuum, forcep, c-section, shoulder dystocia and resulting potential nerve injury.  - Discussed diet and exercise modifications. Diabetes education referral recommended. Reviewed the importance of health weight gain and reviewed IOM recommendations.  - We discussed the possibility of management of the pregnancy with medications I.e. Metformin or Insulin. Discussed if medication initiated, that monitoring during the pregnancy will be started at 32 wks or at the time of medication initiation. Serial US  for growth would be started as well. Discussed that the timing of delivery would then be in the 39th week - Counseled on importance of postpartum follow up testing to ensure resolution and ensure patient does not have ongoing DM - Discussed lifelong increased risk of DM and increased risk of GDM in future pregnancies  4. Marginal insertion of umbilical cord affecting management of mother in second trimester Growth US  scheduled tomorrow  5. Anemia, unspecified type - Iron, TIBC and Ferritin Panel - B12 and Folate Panel  Please refer to After Visit Summary for other counseling recommendations.   Return in about 2 weeks (around 09/02/2024) for return OB at 30 weeks with MD.  Future Appointments  Date Time Provider Department Center  08/20/2024  1:15 PM [facility] PROVIDER 1 [facility]  08/20/2024  1:30 PM [facility] US1 [facility]   Kieth JAYSON Carolin, MD

## 2024-08-20 ENCOUNTER — Ambulatory Visit

## 2024-08-20 ENCOUNTER — Ambulatory Visit: Attending: Maternal & Fetal Medicine | Admitting: Maternal & Fetal Medicine

## 2024-08-20 DIAGNOSIS — Z3A28 28 weeks gestation of pregnancy: Secondary | ICD-10-CM | POA: Diagnosis not present

## 2024-08-20 DIAGNOSIS — O43192 Other malformation of placenta, second trimester: Secondary | ICD-10-CM

## 2024-08-20 DIAGNOSIS — O99333 Smoking (tobacco) complicating pregnancy, third trimester: Secondary | ICD-10-CM | POA: Insufficient documentation

## 2024-08-20 DIAGNOSIS — O43193 Other malformation of placenta, third trimester: Secondary | ICD-10-CM | POA: Insufficient documentation

## 2024-08-20 DIAGNOSIS — O24410 Gestational diabetes mellitus in pregnancy, diet controlled: Secondary | ICD-10-CM | POA: Insufficient documentation

## 2024-08-20 LAB — IRON,TIBC AND FERRITIN PANEL
Ferritin: 28 ng/mL (ref 15–150)
Iron Saturation: 46 % (ref 15–55)
Iron: 161 ug/dL — ABNORMAL HIGH (ref 27–159)
Total Iron Binding Capacity: 347 ug/dL (ref 250–450)
UIBC: 186 ug/dL (ref 131–425)

## 2024-08-20 LAB — B12 AND FOLATE PANEL
Folate: >20 ng/mL (ref >3.0)
Vitamin B-12: 293 pg/mL (ref 232–1245)

## 2024-08-20 NOTE — Progress Notes (Signed)
 Patient information  Patient Name: Shannon Lewis  Patient MRN:   [id number]  Referring practice: MFM Referring Provider: [place] [facility]  Problem List   Patient Active Problem List   Diagnosis Date Noted   GDM (gestational diabetes mellitus) 08/07/2024   Anemia 08/07/2024   Marginal insertion of umbilical cord affecting management of mother in second trimester 06/24/2024   Vapes nicotine containing substance 04/15/2024   Supervision of other normal pregnancy, antepartum 04/03/2024   Maternal Fetal medicine Consult  Marja DECKLYN HORNIK is a 28 y.o. G3P1011 at [redacted]w[redacted]d here for ultrasound and consultation. Jana R Zeiders is doing well today with no acute concerns. Today we focused on the following:   The patient is here for growth ultrasound due to marginal cord insertion.  She also reports that she is no longer using any nicotine devices.  I discussed the importance of growth ultrasounds due to her gestational diabetes as well as the marginal cord insertion.  She reports that her blood sugars are all at goal with diet alone.   Today's ultrasounds shows normal fetal growth with an estimated fetal weight at the 30th percentile overall and normal amniotic fluid. There was also appropriate fetal movement and tone for this gestational age.   The patient had time to ask questions that were answered to her satisfaction.  She verbalized understanding and agrees to proceed with the plan below.  Standard OB precautions were given to the patient when appropriate based on the gestational age (fetal kick counts, importance of attending prenatal visits, any antenatal testing or ultrasounds that are scheduled as well as monitoring for signs and symptoms that is labor, vaginal bleeding, loss of amniotic fluid, or decreased fetal movement).   Recommendations -Serial growth ultrasounds every 4-6 weeks until delivery -If greater than 25% of her blood sugar values are not at goal then she should  improve her diet if possible as well as increase exercise.  If after a week of diet and exercise this is still not achieving at least 75% of glucose values at goal then medication should be considered starting with insulin with metformin as a second line agent.  Antenatal testing then would be indicated at 32 weeks as well.  I spent 20 minutes reviewing the patients chart, including labs and images as well as counseling the patient about her medical conditions. Greater than 50% of the time was spent in direct face-to-face patient counseling.  Delora Smaller  MFM, [place]   08/20/2024  2:06 PM   Review of Systems: A review of systems was performed and was negative except per HPI   Vitals and Physical Exam    08/20/2024    1:18 PM 08/19/2024    2:56 PM 08/05/2024    9:50 AM  Vitals with BMI  Weight  135 lbs 132 lbs 5 oz  Systolic 111 101 897  Diastolic 54 62 62  Pulse 77 94 83    Sitting comfortably on the sonogram table Nonlabored breathing Normal rate and rhythm Abdomen is nontender  Past pregnancies OB History  Gravida Para Term Preterm AB Living  3 1 1  0 1 1  SAB IAB Ectopic Multiple Live Births  0 1 0 0 1    # Outcome Date GA Lbr Len/2nd Weight Sex Type Anes PTL Lv  3 Current           2 Term 06/23/19 [redacted]w[redacted]d 24:11 / 00:32 7 lb 12.2 oz (3.52 kg) F Vag-Spont EPI  LIV  1 IAB 2017             Future Appointments  Date Time Provider Department Center  09/02/2024  1:50 PM Zina Jerilynn LABOR, MD [facility] None

## 2024-08-21 ENCOUNTER — Ambulatory Visit: Payer: Self-pay | Admitting: Obstetrics and Gynecology

## 2024-08-21 DIAGNOSIS — D649 Anemia, unspecified: Secondary | ICD-10-CM

## 2024-08-21 MED ORDER — FERROUS SULFATE 325 (65 FE) MG PO TABS: 325.0000 mg | ORAL_TABLET | ORAL | 1 refills | Status: AC

## 2024-08-21 MED ORDER — B-12 1000 MCG PO CAPS: 1.0000 | ORAL_CAPSULE | Freq: Every day | ORAL | 1 refills | Status: AC

## 2024-09-02 ENCOUNTER — Ambulatory Visit (INDEPENDENT_AMBULATORY_CARE_PROVIDER_SITE_OTHER): Admitting: Obstetrics and Gynecology

## 2024-09-02 VITALS — BP 98/61 | HR 83 | Wt 133.0 lb

## 2024-09-02 DIAGNOSIS — O24410 Gestational diabetes mellitus in pregnancy, diet controlled: Secondary | ICD-10-CM

## 2024-09-02 DIAGNOSIS — Z3A30 30 weeks gestation of pregnancy: Secondary | ICD-10-CM

## 2024-09-02 DIAGNOSIS — O43192 Other malformation of placenta, second trimester: Secondary | ICD-10-CM

## 2024-09-02 DIAGNOSIS — Z3480 Encounter for supervision of other normal pregnancy, unspecified trimester: Secondary | ICD-10-CM

## 2024-09-02 NOTE — Progress Notes (Signed)
° °  PRENATAL VISIT NOTE  Subjective:  Shannon Lewis is a 28 y.o. G3P1011 at [redacted]w[redacted]d being seen today for ongoing prenatal care.  She is currently monitored for the following issues for this high-risk pregnancy and has Supervision of other normal pregnancy, antepartum; Vapes nicotine containing substance; Marginal insertion of umbilical cord affecting management of mother in second trimester; GDM (gestational diabetes mellitus); and Anemia on their problem list.  Patient doing well with no acute concerns today. She reports backache.  Contractions: Irritability. Vag. Bleeding: None.  Movement: Present. Denies leaking of fluid.   The following portions of the patient's history were reviewed and updated as appropriate: allergies, current medications, past family history, past medical history, past social history, past surgical history and problem list. Problem list updated.  Objective:   Vitals:   09/02/24 1350  BP: 98/61  Pulse: 83  Weight: 133 lb (60.3 kg)    Fetal Status: Fetal Heart Rate (bpm): 145 Fundal Height: 31 cm Movement: Present     General:  Alert, oriented and cooperative. Patient is in no acute distress.  Skin: Skin is warm and dry. No rash noted.   Cardiovascular: Normal heart rate noted  Respiratory: Normal respiratory effort, no problems with respiration noted  Abdomen: Soft, gravid, appropriate for gestational age.  Pain/Pressure: Present     Pelvic: Cervical exam deferred        Extremities: Normal range of motion.     Mental Status:  Normal mood and affect. Normal behavior. Normal judgment and thought content.   Assessment and Plan:  Pregnancy: G3P1011 at [redacted]w[redacted]d  1. [redacted] weeks gestation of pregnancy (Primary)   2. Diet controlled gestational diabetes mellitus (GDM) in third trimester FBS: 78-92 PPBS: 70-108  Good Blood sugar control noted  3. Supervision of other normal pregnancy, antepartum Continue routine prenatal care  4. Marginal insertion of umbilical  cord affecting management of mother in second trimester F/u u/s 10/15/24, earlier if schedule permitting  Preterm labor symptoms and general obstetric precautions including but not limited to vaginal bleeding, contractions, leaking of fluid and fetal movement were reviewed in detail with the patient.  Please refer to After Visit Summary for other counseling recommendations.   Return in about 2 weeks (around 09/16/2024) for [facility], in person.   Jerilynn Buddle, MD Faculty Attending Center for [facility]

## 2024-09-18 ENCOUNTER — Ambulatory Visit (INDEPENDENT_AMBULATORY_CARE_PROVIDER_SITE_OTHER): Payer: Self-pay | Admitting: Obstetrics and Gynecology

## 2024-09-18 VITALS — BP 102/64 | HR 90 | Wt 139.0 lb

## 2024-09-18 DIAGNOSIS — O43193 Other malformation of placenta, third trimester: Secondary | ICD-10-CM

## 2024-09-18 DIAGNOSIS — Z3A32 32 weeks gestation of pregnancy: Secondary | ICD-10-CM

## 2024-09-18 DIAGNOSIS — O24410 Gestational diabetes mellitus in pregnancy, diet controlled: Secondary | ICD-10-CM | POA: Diagnosis not present

## 2024-09-18 DIAGNOSIS — Z3480 Encounter for supervision of other normal pregnancy, unspecified trimester: Secondary | ICD-10-CM

## 2024-09-18 DIAGNOSIS — O43192 Other malformation of placenta, second trimester: Secondary | ICD-10-CM

## 2024-09-18 NOTE — Progress Notes (Signed)
" ° °  PRENATAL VISIT NOTE  Subjective:  Shannon Lewis is a 28 y.o. G3P1011 at [redacted]w[redacted]d being seen today for ongoing prenatal care.  She is currently monitored for the following issues for this high-risk pregnancy and has Supervision of other normal pregnancy, antepartum; Vapes nicotine containing substance; Marginal insertion of umbilical cord affecting management of mother in second trimester; GDM (gestational diabetes mellitus); and Anemia on their problem list.  Patient doing well with no acute concerns today. She reports no complaints.  Contractions: Irritability. Vag. Bleeding: None.  Movement: Present. Denies leaking of fluid.   The following portions of the patient's history were reviewed and updated as appropriate: allergies, current medications, past family history, past medical history, past social history, past surgical history and problem list. Problem list updated.  Objective:   Vitals:   09/18/24 1020  BP: 102/64  Pulse: 90  Weight: 139 lb (63 kg)    Fetal Status: Fetal Heart Rate (bpm): 145 Fundal Height: 32 cm Movement: Present     General:  Alert, oriented and cooperative. Patient is in no acute distress.  Skin: Skin is warm and dry. No rash noted.   Cardiovascular: Normal heart rate noted  Respiratory: Normal respiratory effort, no problems with respiration noted  Abdomen: Soft, gravid, appropriate for gestational age.  Pain/Pressure: Present     Pelvic: Cervical exam deferred        Extremities: Normal range of motion.     Mental Status:  Normal mood and affect. Normal behavior. Normal judgment and thought content.   Assessment and Plan:  Pregnancy: G3P1011 at [redacted]w[redacted]d  1. [redacted] weeks gestation of pregnancy (Primary)   2. Diet controlled gestational diabetes mellitus (GDM) in third trimester FBS: 74-87 PPBS: 79-94 Repeat growth 10/15/24  3. Supervision of other normal pregnancy, antepartum Continue routine prenatal care  4. Marginal insertion of umbilical cord  affecting management of mother in second trimester   Preterm labor symptoms and general obstetric precautions including but not limited to vaginal bleeding, contractions, leaking of fluid and fetal movement were reviewed in detail with the patient.  Please refer to After Visit Summary for other counseling recommendations.   Return in about 2 weeks (around 10/02/2024) for [facility], in person.   Jerilynn Buddle, MD Faculty Attending Center for [facility]   "

## 2024-10-02 ENCOUNTER — Telehealth (INDEPENDENT_AMBULATORY_CARE_PROVIDER_SITE_OTHER): Payer: Self-pay | Admitting: Physician Assistant

## 2024-10-02 ENCOUNTER — Encounter: Payer: Self-pay | Admitting: Physician Assistant

## 2024-10-02 DIAGNOSIS — D649 Anemia, unspecified: Secondary | ICD-10-CM

## 2024-10-02 DIAGNOSIS — O43193 Other malformation of placenta, third trimester: Secondary | ICD-10-CM | POA: Diagnosis not present

## 2024-10-02 DIAGNOSIS — Z3A34 34 weeks gestation of pregnancy: Secondary | ICD-10-CM | POA: Diagnosis not present

## 2024-10-02 DIAGNOSIS — O99013 Anemia complicating pregnancy, third trimester: Secondary | ICD-10-CM

## 2024-10-02 DIAGNOSIS — O24410 Gestational diabetes mellitus in pregnancy, diet controlled: Secondary | ICD-10-CM

## 2024-10-02 DIAGNOSIS — O43192 Other malformation of placenta, second trimester: Secondary | ICD-10-CM

## 2024-10-02 DIAGNOSIS — Z3480 Encounter for supervision of other normal pregnancy, unspecified trimester: Secondary | ICD-10-CM

## 2024-10-02 NOTE — Progress Notes (Signed)
 "   Provider location: Center for [facility]   Patient location: Home  I connected with Tannisha Daidone on 10/02/24 at 2:30 pm by Mychart Video Encounter and verified that I am speaking with the correct person using two identifiers.       I discussed the limitations, risks, security and privacy concerns of performing an evaluation and management service virtually and the availability of in person appointments. I also discussed with the patient that there may be a patient responsible charge related to this service. The patient expressed understanding and agreed to proceed.  PRENATAL VISIT NOTE  Subjective:  Shannon Lewis is a 29 y.o. G3P1011 at [redacted]w[redacted]d being seen today for ongoing prenatal care.  She is currently monitored for the following issues for this high-risk pregnancy and has Supervision of other normal pregnancy, antepartum; Vapes nicotine containing substance; Marginal insertion of umbilical cord affecting management of mother in second trimester; GDM (gestational diabetes mellitus); and Anemia on their problem list.  Patient reports no complaints.  Contractions: Irritability. Vag. Bleeding: None.  Movement: Present. Denies leaking of fluid.   The following portions of the patient's history were reviewed and updated as appropriate: allergies, current medications, past family history, past medical history, past social history, past surgical history and problem list.   Objective:   There were no vitals filed for this visit.  Fetal Status:      Movement: Present    General: Alert, oriented and cooperative. Patient is in no acute distress.  Skin: Skin is warm and dry. No rash noted.   Cardiovascular: Normal heart rate noted  Respiratory: Normal respiratory effort, no problems with respiration noted  Abdomen: Soft, gravid, appropriate for gestational age.  Pain/Pressure: Present     Pelvic: Cervical exam deferred        Extremities: Normal range of motion.   Edema: Trace  Mental Status: Normal mood and affect. Normal behavior. Normal judgment and thought content.      08/19/2024    2:59 PM 04/15/2024    3:44 PM 04/03/2024    1:36 PM  Depression screen PHQ 2/9  Decreased Interest 0 2 0  Down, Depressed, Hopeless 0 0 0  PHQ - 2 Score 0 2 0  Altered sleeping 0 2 0  Tired, decreased energy 2 3 0  Change in appetite 1 1 0  Feeling bad or failure about yourself  0 0 0  Trouble concentrating 0 2 1  Moving slowly or fidgety/restless 0 1 0  Suicidal thoughts 0 0 0  PHQ-9 Score 3 11  1    Difficult doing work/chores Not difficult at all Not difficult at all      Data saved with a previous flowsheet row definition        08/19/2024    3:00 PM 04/03/2024    1:37 PM  GAD 7 : Generalized Anxiety Score  Nervous, Anxious, on Edge 0 1  Control/stop worrying 0 0  Worry too much - different things 0 0  Trouble relaxing 0 0  Restless 0 0  Easily annoyed or irritable 0 1  Afraid - awful might happen 0 0  Total GAD 7 Score 0 2  Anxiety Difficulty Not difficult at all     Assessment and Plan:  Pregnancy: G3P1011 at [redacted]w[redacted]d  1. Supervision of other normal pregnancy, antepartum (Primary) Patient doing well, feeling regular fetal movement  2. [redacted] weeks gestation of pregnancy Anticipatory guidance about next visits/weeks of pregnancy given  3. Diet controlled  gestational diabetes mellitus (GDM) in third trimester Well-controlled with diet FBS: 68-73 PPBS: 80-95  4. Marginal insertion of umbilical cord affecting management of mother in second trimester 08/20/24 EFW 1152 gm (30%)  5. Anemia, unspecified type Hgb 10.1 on 08/06/24, compliant on oral iron   Preterm labor symptoms and general obstetric precautions including but not limited to vaginal bleeding, contractions, leaking of fluid and fetal movement were reviewed in detail with the patient.  Please refer to After Visit Summary for other counseling recommendations.   Return in about 1 week  (around 10/09/2024) for [facility].  Future Appointments  Date Time Provider Department Center  10/15/2024 11:15 AM [facility] PROVIDER 1 [facility]  10/15/2024 11:30 AM [facility] US3 [facility]  10/16/2024 10:15 AM Delores Nidia CROME, FNP [facility] None    Jorene FORBES Moats, PA-C  "

## 2024-10-02 NOTE — Progress Notes (Signed)
 Pt presents for rob. Pt has no questions or concerns at this time.

## 2024-10-08 ENCOUNTER — Encounter: Payer: Self-pay | Admitting: Obstetrics

## 2024-10-08 ENCOUNTER — Ambulatory Visit: Admitting: Obstetrics

## 2024-10-08 VITALS — BP 112/68 | HR 98 | Wt 149.1 lb

## 2024-10-08 DIAGNOSIS — Z720 Tobacco use: Secondary | ICD-10-CM | POA: Diagnosis not present

## 2024-10-08 DIAGNOSIS — O24410 Gestational diabetes mellitus in pregnancy, diet controlled: Secondary | ICD-10-CM | POA: Diagnosis not present

## 2024-10-08 DIAGNOSIS — D508 Other iron deficiency anemias: Secondary | ICD-10-CM

## 2024-10-08 DIAGNOSIS — O43193 Other malformation of placenta, third trimester: Secondary | ICD-10-CM

## 2024-10-08 DIAGNOSIS — Z3A35 35 weeks gestation of pregnancy: Secondary | ICD-10-CM

## 2024-10-08 DIAGNOSIS — O43192 Other malformation of placenta, second trimester: Secondary | ICD-10-CM

## 2024-10-08 DIAGNOSIS — Z3480 Encounter for supervision of other normal pregnancy, unspecified trimester: Secondary | ICD-10-CM

## 2024-10-08 NOTE — Progress Notes (Signed)
 Subjective:  Shannon Lewis is a 29 y.o. G3P1011 at [redacted]w[redacted]d being seen today for ongoing prenatal care.  She is currently monitored for the following issues for this low-risk pregnancy and has Supervision of other normal pregnancy, antepartum; Vapes nicotine containing substance; Marginal insertion of umbilical cord affecting management of mother in second trimester; GDM (gestational diabetes mellitus); and Anemia on their problem list.  Patient reports heartburn.  Contractions: Irritability. Vag. Bleeding: None.  Movement: Present. Denies leaking of fluid.   The following portions of the patient's history were reviewed and updated as appropriate: allergies, current medications, past family history, past medical history, past social history, past surgical history and problem list. Problem list updated.  Objective:   Vitals:   10/08/24 1621  BP: 112/68  Pulse: 98  Weight: 149 lb 1.6 oz (67.6 kg)    Fetal Status: Fetal Heart Rate (bpm): 144   Movement: Present     General:  Alert, oriented and cooperative. Patient is in no acute distress.  Skin: Skin is warm and dry. No rash noted.   Cardiovascular: Normal heart rate noted  Respiratory: Normal respiratory effort, no problems with respiration noted  Abdomen: Soft, gravid, appropriate for gestational age. Pain/Pressure: Present     Pelvic:  Cervical exam deferred        Extremities: Normal range of motion.  Edema: Trace  Mental Status: Normal mood and affect. Normal behavior. Normal judgment and thought content.   Urinalysis:      Assessment and Plan:  Pregnancy: G3P1011 at [redacted]w[redacted]d  1. Supervision of other normal pregnancy, antepartum (Primary)  2. Diet controlled gestational diabetes mellitus (GDM) in third trimester - good glucose control:  FBS = 70-90   and   2 Hour PP = 100-110  3. Marginal insertion of umbilical cord affecting management of mother in second trimester  4. Iron deficiency anemia secondary to inadequate dietary  iron intake - taking iron  5. Vapes nicotine containing substance - cessation recommended    Preterm labor symptoms and general obstetric precautions including but not limited to vaginal bleeding, contractions, leaking of fluid and fetal movement were reviewed in detail with the patient. Please refer to After Visit Summary for other counseling recommendations.   Return in about 1 week (around 10/15/2024) for [facility]   Rudy Carlin LABOR, MD 10/08/2024

## 2024-10-08 NOTE — Progress Notes (Signed)
 Pt presents for rob. Pt has no questions or concerns at this time.

## 2024-10-15 ENCOUNTER — Ambulatory Visit

## 2024-10-16 ENCOUNTER — Other Ambulatory Visit (HOSPITAL_COMMUNITY)
Admission: RE | Admit: 2024-10-16 | Discharge: 2024-10-16 | Disposition: A | Discharge status: Home / Self Care | Source: Ambulatory Visit | Attending: Physician Assistant | Admitting: Physician Assistant

## 2024-10-16 ENCOUNTER — Ambulatory Visit: Payer: Self-pay | Admitting: Physician Assistant

## 2024-10-16 ENCOUNTER — Encounter: Payer: Self-pay | Admitting: Obstetrics and Gynecology

## 2024-10-16 VITALS — BP 111/67 | HR 93 | Wt 140.0 lb

## 2024-10-16 DIAGNOSIS — O43192 Other malformation of placenta, second trimester: Secondary | ICD-10-CM

## 2024-10-16 DIAGNOSIS — D649 Anemia, unspecified: Secondary | ICD-10-CM | POA: Diagnosis not present

## 2024-10-16 DIAGNOSIS — Z3480 Encounter for supervision of other normal pregnancy, unspecified trimester: Secondary | ICD-10-CM | POA: Diagnosis present

## 2024-10-16 DIAGNOSIS — O24410 Gestational diabetes mellitus in pregnancy, diet controlled: Secondary | ICD-10-CM

## 2024-10-16 DIAGNOSIS — Z3A36 36 weeks gestation of pregnancy: Secondary | ICD-10-CM | POA: Diagnosis not present

## 2024-10-16 DIAGNOSIS — O43193 Other malformation of placenta, third trimester: Secondary | ICD-10-CM

## 2024-10-16 NOTE — Progress Notes (Signed)
 "  PRENATAL VISIT NOTE  Subjective:  Shannon Lewis is a 29 y.o. G3P1011 at [redacted]w[redacted]d being seen today for ongoing prenatal care.  She is currently monitored for the following issues for this high-risk pregnancy and has Supervision of other normal pregnancy, antepartum; Vapes nicotine containing substance; Marginal insertion of umbilical cord affecting management of mother in second trimester; GDM (gestational diabetes mellitus); and Anemia on their problem list.  Patient reports no complaints.  Contractions: Irregular. Vag. Bleeding: None.  Movement: Present. Denies leaking of fluid.   The following portions of the patient's history were reviewed and updated as appropriate: allergies, current medications, past family history, past medical history, past social history, past surgical history and problem list.   Objective:   Vitals:   10/16/24 1352  BP: 111/67  Pulse: 93  Weight: 140 lb (63.5 kg)    Fetal Status:  Fetal Heart Rate (bpm): 149 Fundal Height: 36 cm Movement: Present    General: Alert, oriented and cooperative. Patient is in no acute distress.  Skin: Skin is warm and dry. No rash noted.   Cardiovascular: Normal heart rate noted  Respiratory: Normal respiratory effort, no problems with respiration noted  Abdomen: Soft, gravid, appropriate for gestational age.  Pain/Pressure: Present     Pelvic: Cervical exam deferred        Extremities: Normal range of motion.  Edema: Trace  Mental Status: Normal mood and affect. Normal behavior. Normal judgment and thought content.      08/19/2024    2:59 PM 04/15/2024    3:44 PM 04/03/2024    1:36 PM  Depression screen PHQ 2/9  Decreased Interest 0 2 0  Down, Depressed, Hopeless 0 0 0  PHQ - 2 Score 0 2 0  Altered sleeping 0 2 0  Tired, decreased energy 2 3 0  Change in appetite 1 1 0  Feeling bad or failure about yourself  0 0 0  Trouble concentrating 0 2 1  Moving slowly or fidgety/restless 0 1 0  Suicidal thoughts 0 0 0  PHQ-9  Score 3 11  1    Difficult doing work/chores Not difficult at all Not difficult at all      Data saved with a previous flowsheet row definition        08/19/2024    3:00 PM 04/03/2024    1:37 PM  GAD 7 : Generalized Anxiety Score  Nervous, Anxious, on Edge 0  1   Control/stop worrying 0  0   Worry too much - different things 0  0   Trouble relaxing 0  0   Restless 0  0   Easily annoyed or irritable 0  1   Afraid - awful might happen 0  0   Total GAD 7 Score 0 2  Anxiety Difficulty Not difficult at all      Data saved with a previous flowsheet row definition    Assessment and Plan:  Pregnancy: G3P1011 at [redacted]w[redacted]d  1. Supervision of other normal pregnancy, antepartum (Primary) Patient doing well, feeling regular fetal movement BP, FHR, FH appropriate   2. [redacted] weeks gestation of pregnancy Anticipatory guidance about next visits/weeks of pregnancy given.   3. Diet controlled gestational diabetes mellitus (GDM) in third trimester Well-controlled FBS: 67-70's PPBS: 70s-80s Delivery at 39.0-39.6 Continue growth Q4  4. Marginal insertion of umbilical cord affecting management of mother in second trimester 12/2//25 EFW 1152 gm (30%)  5. Anemia, unspecified type 11/18 hgb 10.1 Continue oral iron  Preterm labor  symptoms and general obstetric precautions including but not limited to vaginal bleeding, contractions, leaking of fluid and fetal movement were reviewed in detail with the patient. Please refer to After Visit Summary for other counseling recommendations.   No follow-ups on file.  No future appointments.  Osias Resnick E Aimee Heldman, PA-C  "

## 2024-10-16 NOTE — Progress Notes (Signed)
 ROB in office, reports fetal movement with some contractions and pressure. Reports fasting BG today was 67

## 2024-10-17 LAB — CERVICOVAGINAL ANCILLARY ONLY
Bacterial Vaginitis (gardnerella): NEGATIVE
Candida Glabrata: NEGATIVE
Candida Vaginitis: POSITIVE — AB
Chlamydia: NEGATIVE
Comment: NEGATIVE
Comment: NEGATIVE
Comment: NEGATIVE
Comment: NEGATIVE
Comment: NEGATIVE
Comment: NORMAL
Neisseria Gonorrhea: NEGATIVE
Trichomonas: NEGATIVE

## 2024-10-21 LAB — CULTURE, BETA STREP (GROUP B ONLY): Strep Gp B Culture: NEGATIVE

## 2024-10-22 ENCOUNTER — Ambulatory Visit (HOSPITAL_COMMUNITY): Payer: Self-pay | Admitting: Physician Assistant

## 2024-10-22 MED ORDER — TERCONAZOLE 0.8 % VA CREA: 1.0000 | TOPICAL_CREAM | Freq: Every day | VAGINAL | 0 refills | Status: AC

## 2024-10-23 ENCOUNTER — Telehealth: Payer: Self-pay | Admitting: Obstetrics and Gynecology

## 2024-10-23 DIAGNOSIS — O24410 Gestational diabetes mellitus in pregnancy, diet controlled: Secondary | ICD-10-CM

## 2024-10-23 DIAGNOSIS — Z3A37 37 weeks gestation of pregnancy: Secondary | ICD-10-CM

## 2024-10-23 DIAGNOSIS — O43192 Other malformation of placenta, second trimester: Secondary | ICD-10-CM

## 2024-10-23 DIAGNOSIS — Z3480 Encounter for supervision of other normal pregnancy, unspecified trimester: Secondary | ICD-10-CM

## 2024-10-23 NOTE — Progress Notes (Signed)
" ° °  OBSTETRICS PRENATAL VIRTUAL VISIT ENCOUNTER NOTE  Provider location: [facility]   Patient location: Home  I connected with Shannon Lewis on 10/23/24 at  9:35 AM EST by MyChart Video Encounter and verified that I am speaking with the correct person using two identifiers. I discussed the limitations, risks, security and privacy concerns of performing an evaluation and management service virtually and the availability of in person appointments. I also discussed with the patient that there may be a patient responsible charge related to this service. The patient expressed understanding and agreed to proceed. Subjective:  Shannon Lewis is a 29 y.o. G3P1011 at [redacted]w[redacted]d being seen today for ongoing prenatal care.  She is currently monitored for the following issues for this high-risk pregnancy and has Supervision of other normal pregnancy, antepartum; Vapes nicotine containing substance; Marginal insertion of umbilical cord affecting management of mother in second trimester; GDM (gestational diabetes mellitus); and Anemia on their problem list.  Patient reports no complaints.  Contractions: Irregular.  .  Movement: Present. Denies any leaking of fluid.   The following portions of the patient's history were reviewed and updated as appropriate: allergies, current medications, past family history, past medical history, past social history, past surgical history and problem list.   Objective:    There were no vitals filed for this visit.  Fetal Status:      Movement: Present    General: Alert, oriented and cooperative. Patient is in no acute distress.  Respiratory: Normal respiratory effort, no problems with respiration noted  Mental Status: Normal mood and affect. Normal behavior. Normal judgment and thought content.  Rest of physical exam deferred due to type of encounter  Imaging: No results found.  Assessment and Plan:  Pregnancy: G3P1011 at [redacted]w[redacted]d 1. [redacted]  weeks gestation of pregnancy (Primary)   2. Diet controlled gestational diabetes mellitus (GDM) in third trimester FBS:70-85 PPBS: 86-98  Need to reschedule growth scan secondary to weather  3. Supervision of other normal pregnancy, antepartum Continue routine prenatal care  4. Marginal insertion of umbilical cord affecting management of mother in second trimester   Term labor symptoms and general obstetric precautions including but not limited to vaginal bleeding, contractions, leaking of fluid and fetal movement were reviewed in detail with the patient. I discussed the assessment and treatment plan with the patient. The patient was provided an opportunity to ask questions and all were answered. The patient agreed with the plan and demonstrated an understanding of the instructions. The patient was advised to call back or seek an in-person office evaluation/go to [facility] at [facility] for any urgent or concerning symptoms. Please refer to After Visit Summary for other counseling recommendations.   I provided 15 minutes of face-to-face time during this encounter.  Return in about 1 week (around 10/30/2024) for [facility], in person.   Jerilynn DELENA Buddle, MD Center for [employer], [facility]  "

## 2024-10-31 ENCOUNTER — Encounter: Payer: Self-pay | Admitting: Obstetrics & Gynecology

## 2024-11-06 ENCOUNTER — Ambulatory Visit
